# Patient Record
Sex: Male | Born: 1950 | Race: Black or African American | Hispanic: No | Marital: Single | Smoking: Current every day smoker
Health system: Southern US, Community
[De-identification: ages and names within clinical notes are randomized; demographics above are authoritative.]

## PROBLEM LIST (undated history)

## (undated) DIAGNOSIS — C61 Malignant neoplasm of prostate: Secondary | ICD-10-CM

## (undated) DIAGNOSIS — M1990 Unspecified osteoarthritis, unspecified site: Secondary | ICD-10-CM

## (undated) DIAGNOSIS — J189 Pneumonia, unspecified organism: Secondary | ICD-10-CM

## (undated) DIAGNOSIS — I10 Essential (primary) hypertension: Secondary | ICD-10-CM

## (undated) DIAGNOSIS — J302 Other seasonal allergic rhinitis: Secondary | ICD-10-CM

## (undated) DIAGNOSIS — I639 Cerebral infarction, unspecified: Secondary | ICD-10-CM

## (undated) DIAGNOSIS — F329 Major depressive disorder, single episode, unspecified: Secondary | ICD-10-CM

## (undated) DIAGNOSIS — F32A Depression, unspecified: Secondary | ICD-10-CM

## (undated) HISTORY — DX: Malignant neoplasm of prostate: C61

## (undated) HISTORY — DX: Cerebral infarction, unspecified: I63.9

## (undated) HISTORY — DX: Major depressive disorder, single episode, unspecified: F32.9

## (undated) HISTORY — DX: Depression, unspecified: F32.A

## (undated) HISTORY — DX: Other seasonal allergic rhinitis: J30.2

## (undated) HISTORY — DX: Pneumonia, unspecified organism: J18.9

## (undated) HISTORY — DX: Unspecified osteoarthritis, unspecified site: M19.90

## (undated) HISTORY — PX: PROSTATE BIOPSY: SHX241

---

## 2010-01-13 ENCOUNTER — Emergency Department: Payer: Self-pay | Admitting: Emergency Medicine

## 2010-08-20 ENCOUNTER — Ambulatory Visit: Payer: Self-pay | Admitting: Oncology

## 2010-08-26 ENCOUNTER — Ambulatory Visit: Payer: Self-pay | Admitting: Oncology

## 2010-09-04 ENCOUNTER — Ambulatory Visit: Payer: Self-pay | Admitting: Oncology

## 2010-09-24 ENCOUNTER — Ambulatory Visit: Payer: Self-pay | Admitting: Internal Medicine

## 2011-01-28 ENCOUNTER — Emergency Department: Payer: Self-pay | Admitting: Emergency Medicine

## 2011-03-06 ENCOUNTER — Emergency Department: Payer: Self-pay | Admitting: Internal Medicine

## 2012-04-13 ENCOUNTER — Emergency Department: Payer: Self-pay | Admitting: Emergency Medicine

## 2012-04-14 LAB — ETHANOL: Ethanol %: 0.295 % — ABNORMAL HIGH (ref 0.000–0.080)

## 2012-08-04 DIAGNOSIS — J189 Pneumonia, unspecified organism: Secondary | ICD-10-CM

## 2012-08-04 HISTORY — DX: Pneumonia, unspecified organism: J18.9

## 2013-04-04 ENCOUNTER — Emergency Department: Payer: Self-pay | Admitting: Emergency Medicine

## 2014-01-29 ENCOUNTER — Emergency Department: Payer: Self-pay | Admitting: Emergency Medicine

## 2014-02-07 ENCOUNTER — Emergency Department: Payer: Self-pay | Admitting: Emergency Medicine

## 2014-02-07 LAB — BASIC METABOLIC PANEL
ANION GAP: 8 (ref 7–16)
BUN: 6 mg/dL — AB (ref 7–18)
CALCIUM: 8.6 mg/dL (ref 8.5–10.1)
CO2: 26 mmol/L (ref 21–32)
Chloride: 105 mmol/L (ref 98–107)
Creatinine: 0.96 mg/dL (ref 0.60–1.30)
EGFR (African American): >60
EGFR (Non-African Amer.): >60
GLUCOSE: 96 mg/dL (ref 65–99)
OSMOLALITY: 275 (ref 275–301)
Potassium: 4 mmol/L (ref 3.5–5.1)
Sodium: 139 mmol/L (ref 136–145)

## 2014-02-07 LAB — DRUG SCREEN, URINE
Amphetamines, Ur Screen: NEGATIVE (ref ?–1000)
BENZODIAZEPINE, UR SCRN: NEGATIVE (ref ?–200)
Barbiturates, Ur Screen: NEGATIVE (ref ?–200)
COCAINE METABOLITE, UR ~~LOC~~: NEGATIVE (ref ?–300)
Cannabinoid 50 Ng, Ur ~~LOC~~: NEGATIVE (ref ?–50)
MDMA (Ecstasy)Ur Screen: NEGATIVE (ref ?–500)
Methadone, Ur Screen: NEGATIVE (ref ?–300)
OPIATE, UR SCREEN: NEGATIVE (ref ?–300)
Phencyclidine (PCP) Ur S: NEGATIVE (ref ?–25)
Tricyclic, Ur Screen: NEGATIVE (ref ?–1000)

## 2014-02-07 LAB — URINALYSIS, COMPLETE
BILIRUBIN, UR: NEGATIVE
Glucose,UR: NEGATIVE mg/dL (ref 0–75)
Ketone: NEGATIVE
Leukocyte Esterase: NEGATIVE
Nitrite: NEGATIVE
PROTEIN: NEGATIVE
Ph: 6 (ref 4.5–8.0)
RBC,UR: NONE SEEN /HPF (ref 0–5)
Specific Gravity: 1.002 (ref 1.003–1.030)
WBC UR: NONE SEEN /HPF (ref 0–5)

## 2014-02-07 LAB — CBC WITH DIFFERENTIAL/PLATELET
BASOS ABS: 0.1 10*3/uL (ref 0.0–0.1)
Basophil %: 2.4 %
Eosinophil #: 0 10*3/uL (ref 0.0–0.7)
Eosinophil %: 1.2 %
HCT: 48.2 % (ref 40.0–52.0)
HGB: 15.2 g/dL (ref 13.0–18.0)
LYMPHS ABS: 1.2 10*3/uL (ref 1.0–3.6)
LYMPHS PCT: 31.8 %
MCH: 24.9 pg — ABNORMAL LOW (ref 26.0–34.0)
MCHC: 31.5 g/dL — ABNORMAL LOW (ref 32.0–36.0)
MCV: 79 fL — ABNORMAL LOW (ref 80–100)
MONOS PCT: 7.8 %
Monocyte #: 0.3 x10 3/mm (ref 0.2–1.0)
NEUTROS ABS: 2.2 10*3/uL (ref 1.4–6.5)
NEUTROS PCT: 56.8 %
Platelet: 156 10*3/uL (ref 150–440)
RBC: 6.08 10*6/uL — ABNORMAL HIGH (ref 4.40–5.90)
RDW: 14.7 % — ABNORMAL HIGH (ref 11.5–14.5)
WBC: 3.8 10*3/uL (ref 3.8–10.6)

## 2014-02-07 LAB — ETHANOL
Ethanol %: 0.368 % (ref 0.000–0.080)
Ethanol: 368 mg/dL

## 2014-04-21 ENCOUNTER — Emergency Department: Payer: Self-pay | Admitting: Emergency Medicine

## 2014-04-21 LAB — URINALYSIS, COMPLETE
BILIRUBIN, UR: NEGATIVE
BLOOD: NEGATIVE
Glucose,UR: NEGATIVE mg/dL (ref 0–75)
KETONE: NEGATIVE
LEUKOCYTE ESTERASE: NEGATIVE
Nitrite: NEGATIVE
PH: 5 (ref 4.5–8.0)
PROTEIN: NEGATIVE
RBC,UR: 1 /HPF (ref 0–5)
Specific Gravity: 1.01 (ref 1.003–1.030)
Squamous Epithelial: 1
WBC UR: 4 /HPF (ref 0–5)

## 2014-04-21 LAB — BASIC METABOLIC PANEL
Anion Gap: 8 (ref 7–16)
BUN: 7 mg/dL (ref 7–18)
CHLORIDE: 112 mmol/L — AB (ref 98–107)
CO2: 24 mmol/L (ref 21–32)
Calcium, Total: 8.5 mg/dL (ref 8.5–10.1)
Creatinine: 1.09 mg/dL (ref 0.60–1.30)
EGFR (African American): >60
EGFR (Non-African Amer.): >60
Glucose: 80 mg/dL (ref 65–99)
Osmolality: 284 (ref 275–301)
Potassium: 3.7 mmol/L (ref 3.5–5.1)
Sodium: 144 mmol/L (ref 136–145)

## 2014-04-21 LAB — CBC WITH DIFFERENTIAL/PLATELET
BASOS ABS: 0.1 10*3/uL (ref 0.0–0.1)
BASOS PCT: 1.3 %
EOS ABS: 0.2 10*3/uL (ref 0.0–0.7)
EOS PCT: 3.3 %
HCT: 42.2 % (ref 40.0–52.0)
HGB: 13.1 g/dL (ref 13.0–18.0)
Lymphocyte #: 1.7 10*3/uL (ref 1.0–3.6)
Lymphocyte %: 27.3 %
MCH: 24.2 pg — ABNORMAL LOW (ref 26.0–34.0)
MCHC: 31.1 g/dL — AB (ref 32.0–36.0)
MCV: 78 fL — AB (ref 80–100)
MONO ABS: 0.4 x10 3/mm (ref 0.2–1.0)
Monocyte %: 6.7 %
NEUTROS ABS: 3.8 10*3/uL (ref 1.4–6.5)
Neutrophil %: 61.4 %
PLATELETS: 243 10*3/uL (ref 150–440)
RBC: 5.42 10*6/uL (ref 4.40–5.90)
RDW: 18.1 % — AB (ref 11.5–14.5)
WBC: 6.2 10*3/uL (ref 3.8–10.6)

## 2014-04-21 LAB — TROPONIN I: Troponin-I: <0.02 ng/mL

## 2014-06-21 ENCOUNTER — Emergency Department: Payer: Self-pay | Admitting: Emergency Medicine

## 2014-06-21 LAB — CBC
HCT: 48.8 % (ref 40.0–52.0)
HGB: 15.5 g/dL (ref 13.0–18.0)
MCH: 24.9 pg — ABNORMAL LOW (ref 26.0–34.0)
MCHC: 31.7 g/dL — AB (ref 32.0–36.0)
MCV: 79 fL — ABNORMAL LOW (ref 80–100)
Platelet: 206 10*3/uL (ref 150–440)
RBC: 6.22 10*6/uL — ABNORMAL HIGH (ref 4.40–5.90)
RDW: 17.1 % — ABNORMAL HIGH (ref 11.5–14.5)
WBC: 5.3 10*3/uL (ref 3.8–10.6)

## 2014-06-21 LAB — URINALYSIS, COMPLETE
Bilirubin,UR: NEGATIVE
Glucose,UR: NEGATIVE mg/dL (ref 0–75)
Ketone: NEGATIVE
Leukocyte Esterase: NEGATIVE
Nitrite: NEGATIVE
Ph: 6 (ref 4.5–8.0)
Protein: NEGATIVE
RBC,UR: 2 /HPF (ref 0–5)
Specific Gravity: 1.002 (ref 1.003–1.030)
Squamous Epithelial: <1

## 2014-06-21 LAB — COMPREHENSIVE METABOLIC PANEL
ALBUMIN: 3.7 g/dL (ref 3.4–5.0)
Alkaline Phosphatase: 78 U/L
Anion Gap: 8 (ref 7–16)
BILIRUBIN TOTAL: 0.2 mg/dL (ref 0.2–1.0)
BUN: 6 mg/dL — AB (ref 7–18)
CALCIUM: 8.5 mg/dL (ref 8.5–10.1)
CHLORIDE: 109 mmol/L — AB (ref 98–107)
CREATININE: 0.98 mg/dL (ref 0.60–1.30)
Co2: 28 mmol/L (ref 21–32)
EGFR (African American): >60
EGFR (Non-African Amer.): >60
Glucose: 93 mg/dL (ref 65–99)
Osmolality: 286 (ref 275–301)
POTASSIUM: 3.4 mmol/L — AB (ref 3.5–5.1)
SGOT(AST): 34 U/L (ref 15–37)
SGPT (ALT): 40 U/L
SODIUM: 145 mmol/L (ref 136–145)
Total Protein: 8 g/dL (ref 6.4–8.2)

## 2014-06-21 LAB — PROTIME-INR
INR: 1.1
Prothrombin Time: 13.8 secs (ref 11.5–14.7)

## 2014-06-23 LAB — URINE CULTURE

## 2015-11-12 ENCOUNTER — Emergency Department
Admission: EM | Admit: 2015-11-12 | Discharge: 2015-11-12 | Disposition: A | Discharge status: Home / Self Care | Payer: Medicaid Other | Attending: Emergency Medicine | Admitting: Emergency Medicine

## 2015-11-12 ENCOUNTER — Emergency Department: Payer: Medicaid Other

## 2015-11-12 ENCOUNTER — Encounter: Payer: Self-pay | Admitting: Emergency Medicine

## 2015-11-12 DIAGNOSIS — F17210 Nicotine dependence, cigarettes, uncomplicated: Secondary | ICD-10-CM | POA: Diagnosis not present

## 2015-11-12 DIAGNOSIS — Y93K1 Activity, walking an animal: Secondary | ICD-10-CM | POA: Diagnosis not present

## 2015-11-12 DIAGNOSIS — W19XXXA Unspecified fall, initial encounter: Secondary | ICD-10-CM

## 2015-11-12 DIAGNOSIS — S20212A Contusion of left front wall of thorax, initial encounter: Secondary | ICD-10-CM | POA: Diagnosis not present

## 2015-11-12 DIAGNOSIS — Y999 Unspecified external cause status: Secondary | ICD-10-CM | POA: Diagnosis not present

## 2015-11-12 DIAGNOSIS — S299XXA Unspecified injury of thorax, initial encounter: Secondary | ICD-10-CM | POA: Diagnosis present

## 2015-11-12 DIAGNOSIS — Y929 Unspecified place or not applicable: Secondary | ICD-10-CM | POA: Diagnosis not present

## 2015-11-12 DIAGNOSIS — W1839XA Other fall on same level, initial encounter: Secondary | ICD-10-CM | POA: Diagnosis not present

## 2015-11-12 DIAGNOSIS — Z888 Allergy status to other drugs, medicaments and biological substances status: Secondary | ICD-10-CM | POA: Insufficient documentation

## 2015-11-12 DIAGNOSIS — Z880 Allergy status to penicillin: Secondary | ICD-10-CM | POA: Insufficient documentation

## 2015-11-12 DIAGNOSIS — I10 Essential (primary) hypertension: Secondary | ICD-10-CM | POA: Insufficient documentation

## 2015-11-12 HISTORY — DX: Essential (primary) hypertension: I10

## 2015-11-12 NOTE — ED Provider Notes (Signed)
[facility] Emergency Department Provider Note    ____________________________________________  Time seen: ~1735  I have reviewed the triage vital signs and the nursing notes.   HISTORY  Chief Complaint Fall   History limited by: Not Limited   HPI Roger [name] is a 65 y.o. male who presents to the emergency department today because of concerns for left chest wall pain, left hip pain and left knee pain. Asian states that he thinks he hurt his left chest when he fell today. He states that he has frequent falls. He states he falls once a week at least. Sounds like this is been going on for months. The patient states that he is being seen both at [facility] for the falls. Additionally they are working him up for bone disorders given chronic pain. Patient states he is out of his pain medication. He denies any chest pain, shortness breath or fevers.    Past Medical History  Diagnosis Date  . Hypertension     There are no active problems to display for this patient.   No past surgical history on file.  No current outpatient prescriptions on file.  Allergies Penicillins and Aspirin  No family history on file.  Social History Social History  Substance Use Topics  . Smoking status: Current Every Day Smoker -- 0.25 packs/day    Types: Cigarettes  . Smokeless tobacco: None  . Alcohol Use: Yes     Comment: alcohol daily    Review of Systems  Constitutional: Negative for fever. Cardiovascular: Negative for chest pain. Respiratory: Negative for shortness of breath. Gastrointestinal: Negative for abdominal pain, vomiting and diarrhea. Genitourinary: Negative for dysuria. Musculoskeletal: Positive for left chest wall, left hip and left knee pain Neurological: Negative for headaches, focal weakness or numbness.  10-point ROS otherwise negative.  ____________________________________________   PHYSICAL EXAM:  VITAL SIGNS: ED Triage  Vitals  Enc Vitals Group     BP 11/12/15 1602 138/74 mmHg     Pulse Rate 11/12/15 1602 103     Resp 11/12/15 1602 18     Temp 11/12/15 1602 98 F (36.7 C)     Temp Source 11/12/15 1602 Oral     SpO2 11/12/15 1602 96 %     Weight 11/12/15 1602 140 lb (63.504 kg)     Height 11/12/15 1602 5\' 7"  (1.702 m)     Head Cir --      Peak Flow --      Pain Score 11/12/15 1551 10   Constitutional: Alert and oriented. Well appearing and in no distress. Eyes: Conjunctivae are normal. PERRL. Normal extraocular movements. ENT   Head: Normocephalic and atraumatic.   Nose: No congestion/rhinnorhea.   Mouth/Throat: Mucous membranes are moist.   Neck: No stridor. Hematological/Lymphatic/Immunilogical: No cervical lymphadenopathy. Cardiovascular: Normal rate, regular rhythm.  No murmurs, rubs, or gallops. Respiratory: Normal respiratory effort without tachypnea nor retractions. Breath sounds are clear and equal bilaterally. No wheezes/rales/rhonchi. Gastrointestinal: Soft and nontender. No distention.  Genitourinary: Deferred Musculoskeletal: Normal range of motion in all extremities. No joint effusions.  No lower extremity tenderness nor edema.No obvious deformities noted to either knee. Neurologic:  Normal speech and language. No gross focal neurologic deficits are appreciated.  Skin:  Skin is warm, dry and intact. No rash noted. Psychiatric: Mood and affect are normal. Speech and behavior are normal. Patient exhibits appropriate insight and judgment.  ____________________________________________    LABS (pertinent positives/negatives)  None  ____________________________________________   EKG  I,  Nance Pear, attending physician, personally viewed and interpreted this EKG  EKG Time: 1604 Rate: 88 Rhythm: normal sinus rhythm Axis: normal Intervals: qtc 442 QRS: narrow ST changes: no st elevation Impression: normal ekg ____________________________________________     RADIOLOGY  CXR IMPRESSION: No acute infiltrate or pleural effusion. No pulmonary edema. There is no pneumothorax. No gross fractures are noted. Stable old fracture of the right eighth rib. Probable old fracture of the right ninth and tenth rib. Clinical correlation is necessary.  Left hip IMPRESSION: No acute fracture or subluxation. Postsurgical changes from prior left acetabular fracture repair. Dystrophic calcifications are noted just lateral to superior acetabulum and above the femoral head.  Left knee IMPRESSION: Mild lateral patellar subluxation. No dislocation. Evidence of old trauma along the distal femur medially without acute fracture evident. No appreciable arthropathic change. No joint effusion.  ____________________________________________   PROCEDURES  Procedure(s) performed: None  Critical Care performed: No  ____________________________________________   INITIAL IMPRESSION / ASSESSMENT AND PLAN / ED COURSE  Pertinent labs & imaging results that were available during my care of the patient were reviewed by me and considered in my medical decision making (see chart for details).  Patient presented to the emergency department today because of concerns primarily for left chest wall pain after the fall. Patient also had pain in the left hip and left knee. Patient was able to get up and walk after the fall to call the rescue squad. X-rays today do not show any acute osseous abnormality. The patient will have an Ace wrap applied to the left knee given some patellar subluxation. Patient states that he is being worked up for his frequent falls.  ____________________________________________   FINAL CLINICAL IMPRESSION(S) / ED DIAGNOSES  Final diagnoses:  Fall, initial encounter  Rib contusion, left, initial encounter     Nance Pear, MD 11/12/15 1810

## 2015-11-12 NOTE — ED Notes (Signed)
Patient presents to the ED via [facility] post fall.  Patient is complaining of rib pain.  Patient states he was walking his dog and his leg "gave out".  Patient showed this RN how his left knee appears deformed.  Patient reports pain when breathing and speaking.  Patient is also complaining of left hip pain.

## 2015-11-12 NOTE — Discharge Instructions (Signed)
Please seek medical attention for any high fevers, chest pain, shortness of breath, change in behavior, persistent vomiting, bloody stool or any other new or concerning symptoms.   Chest Contusion A contusion is a deep bruise. Bruises happen when an injury causes bleeding under the skin. Signs of bruising include pain, puffiness (swelling), and discolored skin. The bruise may turn blue, purple, or yellow.  HOME CARE  Put ice on the injured area.  Put ice in a plastic bag.  Place a towel between the skin and the bag.  Leave the ice on for 15-20 minutes at a time, 03-04 times a day for the first 48 hours.  Only take medicine as told by your doctor.  Rest.  Take deep breaths (deep-breathing exercises) as told by your doctor.  Stop smoking if you smoke.  Do not lift objects over 5 pounds (2.3 kilograms) for 3 days or longer if told by your doctor. GET HELP RIGHT AWAY IF:   You have more bruising or puffiness.  You have pain that gets worse.  You have trouble breathing.  You are dizzy, weak, or pass out (faint).  You have blood in your pee (urine) or poop (stool).  You cough up or throw up (vomit) blood.  Your puffiness or pain is not helped with medicines. MAKE SURE YOU:   Understand these instructions.  Will watch your condition.  Will get help right away if you are not doing well or get worse.   This information is not intended to replace advice given to you by your health care provider. Make sure you discuss any questions you have with your health care provider.   Document Released: 01/07/2008 Document Revised: 04/14/2012 Document Reviewed: 01/12/2012 Elsevier Interactive Patient Education [employer].

## 2016-09-22 ENCOUNTER — Other Ambulatory Visit (HOSPITAL_COMMUNITY): Payer: Self-pay | Admitting: Radiation Oncology

## 2016-09-22 DIAGNOSIS — C61 Malignant neoplasm of prostate: Secondary | ICD-10-CM

## 2016-09-26 ENCOUNTER — Ambulatory Visit (HOSPITAL_COMMUNITY)
Admission: RE | Admit: 2016-09-26 | Discharge: 2016-09-26 | Disposition: A | Discharge status: Home / Self Care | Payer: Medicare Other | Source: Ambulatory Visit | Attending: Radiation Oncology | Admitting: Radiation Oncology

## 2016-09-26 DIAGNOSIS — K769 Liver disease, unspecified: Secondary | ICD-10-CM | POA: Diagnosis not present

## 2016-09-26 DIAGNOSIS — C61 Malignant neoplasm of prostate: Secondary | ICD-10-CM | POA: Insufficient documentation

## 2016-09-26 LAB — POCT I-STAT CREATININE: Creatinine, Ser: 0.8 mg/dL (ref 0.61–1.24)

## 2016-09-26 MED ORDER — GADOBENATE DIMEGLUMINE 529 MG/ML IV SOLN
15.0000 mL | Freq: Once | INTRAVENOUS | Status: AC | PRN
  Administered 2016-09-26: 13 mL via INTRAVENOUS

## 2016-10-09 ENCOUNTER — Telehealth: Payer: Self-pay

## 2016-10-09 ENCOUNTER — Encounter: Payer: Self-pay | Admitting: Nurse Practitioner

## 2016-10-09 ENCOUNTER — Other Ambulatory Visit: Payer: Self-pay

## 2016-10-09 ENCOUNTER — Ambulatory Visit (INDEPENDENT_AMBULATORY_CARE_PROVIDER_SITE_OTHER): Payer: Medicare Other | Admitting: Nurse Practitioner

## 2016-10-09 DIAGNOSIS — C61 Malignant neoplasm of prostate: Secondary | ICD-10-CM

## 2016-10-09 DIAGNOSIS — R935 Abnormal findings on diagnostic imaging of other abdominal regions, including retroperitoneum: Secondary | ICD-10-CM

## 2016-10-09 MED ORDER — PEG 3350-KCL-NA BICARB-NACL 420 G PO SOLR: 4000.0000 mL | ORAL | 0 refills | Status: DC

## 2016-10-09 NOTE — Assessment & Plan Note (Addendum)
The patient is being seen by [facility] cancer center for prostate cancer. CT of the abdomen showed irregular wall thickening in the cecum with mild adjacent fat stranding possibly inflammatory but underlying primary lesion could not be excluded. He has not had a colonoscopy and an unknown amount of time. They are requesting colonoscopy to evaluate for underlying lesion prior to starting radiation therapy for prostate cancer. We will plan to proceed with colonoscopy as soon as possible in order to allow [facility] to continue with her cancer care. Return for follow-up based on postprocedure recommendations.  Proceed with colonoscopy with possible sedation augmentation with Dr. Oneida Alar in the near future. The risks, benefits, and alternatives have been discussed in detail with the patient. They state understanding and desire to proceed.   Patient is on Neurontin and Remeron once daily. Drinks 4 beers a week. No other anticoagulants, anxiolytics, chronic pain medications, or antidepressants. He may require sedation augmentation, and I will discuss this with Dr. Oneida Alar for final plans in order to allow his EGD in evaluation as possible with the most appropriate sedation possible.

## 2016-10-09 NOTE — Progress Notes (Signed)
cc'ed to pcp °

## 2016-10-09 NOTE — Progress Notes (Signed)
Discussed with Dr. Oneida Alar. Will schedule on Monday 10/13/16 with conscious sedation and 12.5 mg pre-procedure Phenergan. Notified Tretha Sciara who will handle scheduling.

## 2016-10-09 NOTE — Telephone Encounter (Signed)
Called Everlene Farrier (caregiver) and informed of colonoscopy 10/13/16. Procedure time changed to 2:15pm, pt to arrive at 1:15pm. New instructions faxed to caregiver at [phone]. Informed her to pick-up rx at [facility] and fleets enema/Dulcolax tablets.

## 2016-10-09 NOTE — Telephone Encounter (Signed)
EG spoke to Dr. Oneida Alar who advised for pt to have Colonoscopy 10/13/16 with Phenergan 12.5mg  pre-procedure. Procedure scheduled for 2:30pm. Tried to call caregiver Verlene Mayer), LMOVM about procedure and asked her to call office.   Rx for prep sent to [facility]. Instructions faxed to pharmacy. Orders entered for procedure.

## 2016-10-09 NOTE — Assessment & Plan Note (Signed)
Being treated at [facility] for prostate cancer. Planned [facility]/radiation therapy. Awaiting colonoscopy to evaluate abnormal CT prior to beginning therapy. See additional details below. Return for follow-up based on postprocedure recommendations.

## 2016-10-09 NOTE — Patient Instructions (Signed)
1. I will discuss with Dr. fields the scheduling of your procedure in order to obtain the procedure as soon as possible. 2. We will call you and notify you of the day in time of your procedure. We will also provide colonoscopy prep instructions as well. 3. Further recommendations to be made after colonoscopy. 4. Return for follow-up based on recommendations made after your colonoscopy.

## 2016-10-09 NOTE — Progress Notes (Addendum)
REVIEWED-NO ADDITIONAL RECOMMENDATIONS.  Primary Care Physician:  Shade Flood, MD Primary Gastroenterologist:  Dr. Oneida Alar  Chief Complaint  Patient presents with  . Colonoscopy    consult    HPI:   Roger [name] is a 66 y.o. male who presents on referral from [facility] cancer center where he is undergoing treatment for prostate cancer. They requested he be seen by GI related to abdominal pain, abnormal MRI of the abdomen, and to request colonoscopy. No colonoscopy records found in our system. Last saw [facility] cancer center on 09/15/2016 with a diagnosis of low intermediate risk prostate cancer. Patient elected radiation. He is not a surgical candidate. CT findings as per below. They are waiting colonoscopy prior to scheduling radiation treatment start.  Cancer center notes reviewed. Notes state requiring colonoscopy after abnormal findings on CT scan completed at [facility] which consisted of irregular wall thickening in the cecum with mild adjacent fat stranding. This may be inflammatory, however a secondary underlying primary lesion cannot be excluded. CT was completed to 03/23/2017. MRI was completed to follow-up on not well characterized liver lesions noted on CT as well.  MRI completed 09/26/2016 which found no acute findings or explanation for the patient's symptoms. Indeterminate lesion in the medial segment of the left hepatic lobe but notation that hepatic metastasis from prostate cancer is uncommon especially with recent diagnosis but cannot be completely excluded and recommend correlation with serum PSA levels and MRI follow-up in 3 months.  The patient lives at [facility] [place], [place]. Today he is accompanied by his caregiver at the facility. Today he states he's already had a colonoscopy at [facility] a month ago. Caregiver states this was a prostate biopsy. He seems to be confusing the two. Admits abdominal pain, has bee intermittent but is now more constant, sometimes  sharp. Has a bowel movement about once a day to three times a day. Stools "pass easily." Denies hematochezia, melena, weight loss. States he has a fever but describes it as "sometimes I get cold." Denies chest pain, dizziness, lightheadedness, syncope, near syncope. Denies any other upper or lower GI symptoms.  Past Medical History:  Diagnosis Date  . Arthritis   . Depression   . Hypertension   . Pneumonia 2014   Required chest tube placement  . Prostate cancer ([place])   . Seasonal allergies   . Stroke (cerebrum) [facility])     Past Surgical History:  Procedure Laterality Date  . PROSTATE BIOPSY      Current Outpatient Prescriptions  Medication Sig Dispense Refill  . amLODipine (NORVASC) 5 MG tablet Take 10 mg by mouth daily.    . beclomethasone (QVAR) 80 MCG/ACT inhaler Inhale 1 puff into the lungs 2 (two) times daily.    . fluticasone (FLONASE) 50 MCG/ACT nasal spray Place 1 spray into both nostrils daily.    . folic acid (FOLVITE) 1 MG tablet Take 1 mg by mouth daily.    Marland Kitchen gabapentin (NEURONTIN) 300 MG capsule Take 300 mg by mouth 2 (two) times daily.    Marland Kitchen ibuprofen (ADVIL,MOTRIN) 600 MG tablet Take 1 tablet by mouth as needed.    . loratadine (CLARITIN) 10 MG tablet Take 10 mg by mouth daily.    . mirtazapine (REMERON) 15 MG tablet Take 15 mg by mouth daily.    . Multiple Vitamin (MULTI-VITAMINS) TABS Take 1 tablet by mouth daily.    Marland Kitchen omeprazole (PRILOSEC) 40 MG capsule Take 1 capsule by mouth daily.    Marland Kitchen  PROAIR HFA 108 (90 Base) MCG/ACT inhaler Take 2 puffs by mouth every 4 (four) hours as needed.    . tamsulosin (FLOMAX) 0.4 MG CAPS capsule Take 1 capsule by mouth daily.    Marland Kitchen thiamine (VITAMIN B-1) 50 MG tablet Take 100 mg by mouth daily.    Marland Kitchen zinc sulfate 220 (50 Zn) MG capsule Take 220 mg by mouth daily.     No current facility-administered medications for this visit.     Allergies as of 10/09/2016 - Review Complete 10/09/2016  Allergen Reaction Noted  . Penicillins  Anaphylaxis 11/12/2015  . Aspirin Hives 11/12/2015    Family History  Problem Relation Age of Onset  . Heart attack Mother   . Diabetes Sister   . CAD Sister   . Kidney failure Brother     Social History   Social History  . Marital status: Single    Spouse name: N/A  . Number of children: N/A  . Years of education: N/A   Occupational History  . Not on file.   Social History Main Topics  . Smoking status: Current Every Day Smoker    Packs/day: 0.50    Years: 30.00    Types: Cigarettes  . Smokeless tobacco: Never Used  . Alcohol use Yes     Comment: 4 cans of beer per week  . Drug use: No  . Sexual activity: Not on file   Other Topics Concern  . Not on file   Social History Narrative  . No narrative on file    Review of Systems: Complete ROS negative except as per HPI.    Physical Exam: BP 140/85   Pulse 79   Temp 97.8 F (36.6 C) (Oral)   Ht 6\' 1"  (1.854 m)   Wt 177 lb (80.3 kg)   BMI 23.35 kg/m  General:   Alert and oriented. Pleasant and cooperative. Well-nourished and well-developed.  Head:  Normocephalic and atraumatic. Eyes:  Without icterus, sclera clear and conjunctiva pink.  Ears:  Normal auditory acuity. Mouth:  No deformity or lesions, oral mucosa pink.  Throat/Neck:  Supple, without mass or thyromegaly. Cardiovascular:  S1, S2 present without murmurs appreciated. Normal pulses noted. Extremities without clubbing or edema. Respiratory:  Clear to auscultation bilaterally. No wheezes, rales, or rhonchi. No distress.  Gastrointestinal:  +BS, soft, non-tender and non-distended. No HSM noted. No guarding or rebound. No masses appreciated.  Rectal:  Deferred  Musculoskalatal:  Symmetrical without gross deformities. Normal posture. Skin:  Intact without significant lesions or rashes. Neurologic:  Alert and oriented x4;  grossly normal neurologically. Psych:  Alert and cooperative. Normal mood and affect. Heme/Lymph/Immune: No significant cervical  adenopathy. No excessive bruising noted.    10/09/2016 11:28 AM   Disclaimer: This note was dictated with voice recognition software. Similar sounding words can inadvertently be transcribed and may not be corrected upon review.

## 2016-10-13 ENCOUNTER — Encounter (HOSPITAL_COMMUNITY)
Admission: RE | Disposition: A | Discharge status: Home Health | Payer: Self-pay | Source: Ambulatory Visit | Attending: Gastroenterology

## 2016-10-13 ENCOUNTER — Ambulatory Visit (HOSPITAL_COMMUNITY)
Admission: RE | Admit: 2016-10-13 | Discharge: 2016-10-13 | Disposition: A | Discharge status: Home Health | Payer: Medicare Other | Source: Ambulatory Visit | Attending: Gastroenterology | Admitting: Gastroenterology

## 2016-10-13 ENCOUNTER — Encounter (HOSPITAL_COMMUNITY): Payer: Self-pay

## 2016-10-13 DIAGNOSIS — Z79899 Other long term (current) drug therapy: Secondary | ICD-10-CM | POA: Diagnosis not present

## 2016-10-13 DIAGNOSIS — Q438 Other specified congenital malformations of intestine: Secondary | ICD-10-CM | POA: Insufficient documentation

## 2016-10-13 DIAGNOSIS — I10 Essential (primary) hypertension: Secondary | ICD-10-CM | POA: Diagnosis not present

## 2016-10-13 DIAGNOSIS — F329 Major depressive disorder, single episode, unspecified: Secondary | ICD-10-CM | POA: Diagnosis not present

## 2016-10-13 DIAGNOSIS — Z881 Allergy status to other antibiotic agents status: Secondary | ICD-10-CM | POA: Diagnosis not present

## 2016-10-13 DIAGNOSIS — K644 Residual hemorrhoidal skin tags: Secondary | ICD-10-CM | POA: Diagnosis not present

## 2016-10-13 DIAGNOSIS — R948 Abnormal results of function studies of other organs and systems: Secondary | ICD-10-CM | POA: Insufficient documentation

## 2016-10-13 DIAGNOSIS — Z8673 Personal history of transient ischemic attack (TIA), and cerebral infarction without residual deficits: Secondary | ICD-10-CM | POA: Insufficient documentation

## 2016-10-13 DIAGNOSIS — Z886 Allergy status to analgesic agent status: Secondary | ICD-10-CM | POA: Insufficient documentation

## 2016-10-13 DIAGNOSIS — Z8546 Personal history of malignant neoplasm of prostate: Secondary | ICD-10-CM | POA: Diagnosis not present

## 2016-10-13 DIAGNOSIS — F17210 Nicotine dependence, cigarettes, uncomplicated: Secondary | ICD-10-CM | POA: Insufficient documentation

## 2016-10-13 DIAGNOSIS — Z7951 Long term (current) use of inhaled steroids: Secondary | ICD-10-CM | POA: Insufficient documentation

## 2016-10-13 DIAGNOSIS — R935 Abnormal findings on diagnostic imaging of other abdominal regions, including retroperitoneum: Secondary | ICD-10-CM

## 2016-10-13 DIAGNOSIS — M1990 Unspecified osteoarthritis, unspecified site: Secondary | ICD-10-CM | POA: Diagnosis not present

## 2016-10-13 DIAGNOSIS — C61 Malignant neoplasm of prostate: Secondary | ICD-10-CM

## 2016-10-13 DIAGNOSIS — K648 Other hemorrhoids: Secondary | ICD-10-CM

## 2016-10-13 HISTORY — PX: COLONOSCOPY: SHX5424

## 2016-10-13 SURGERY — COLONOSCOPY
Anesthesia: Moderate Sedation

## 2016-10-13 MED ORDER — MIDAZOLAM HCL 5 MG/5ML IJ SOLN
INTRAMUSCULAR | Status: DC | PRN
  Administered 2016-10-13: 2 mg via INTRAVENOUS
  Administered 2016-10-13: 1 mg via INTRAVENOUS

## 2016-10-13 MED ORDER — PROMETHAZINE HCL 25 MG/ML IJ SOLN
12.5000 mg | Freq: Once | INTRAMUSCULAR | Status: AC
  Administered 2016-10-13: 12.5 mg via INTRAVENOUS

## 2016-10-13 MED ORDER — MIDAZOLAM HCL 5 MG/5ML IJ SOLN
INTRAMUSCULAR | Status: AC
  Filled 2016-10-13: qty 10

## 2016-10-13 MED ORDER — PROMETHAZINE HCL 25 MG/ML IJ SOLN
INTRAMUSCULAR | Status: AC
  Filled 2016-10-13: qty 1

## 2016-10-13 MED ORDER — MEPERIDINE HCL 100 MG/ML IJ SOLN
INTRAMUSCULAR | Status: DC | PRN
  Administered 2016-10-13: 25 mg via INTRAVENOUS

## 2016-10-13 MED ORDER — SODIUM CHLORIDE 0.9% FLUSH
INTRAVENOUS | Status: AC
  Filled 2016-10-13: qty 10

## 2016-10-13 MED ORDER — MEPERIDINE HCL 100 MG/ML IJ SOLN
INTRAMUSCULAR | Status: AC
  Filled 2016-10-13: qty 2

## 2016-10-13 MED ORDER — SODIUM CHLORIDE 0.9 % IV SOLN
INTRAVENOUS | Status: DC
  Administered 2016-10-13: 1000 mL via INTRAVENOUS

## 2016-10-13 NOTE — Op Note (Addendum)
[facility] Patient Name: Roger [name] Procedure Date: 10/13/2016 1:55 PM MRN: [id number] Date of Birth: 26-Aug-1950 Attending MD: Barney Drain , MD CSN: [id number] Age: 66 Admit Type: Outpatient Procedure:                Colonoscopy, DIAGNOSTIC Indications:              Abnormal CT of the GI tract Providers:                Barney Drain, MD, Janeece Riggers, RN, [place],                            Merchant navy officer Referring MD:             Shade Flood Medicines:                Meperidine 25 mg IV, Midazolam 3 mg IV,                            Promethazine 46.5 mg IV Complications:            No immediate complications. Estimated Blood Loss:     Estimated blood loss: none. Procedure:                Pre-Anesthesia Assessment:                           - Prior to the procedure, a History and Physical                            was performed, and patient medications and                            allergies were reviewed. The patient's tolerance of                            previous anesthesia was also reviewed. The risks                            and benefits of the procedure and the sedation                            options and risks were discussed with the patient.                            All questions were answered, and informed consent                            was obtained. Prior Anticoagulants: The patient has                            taken no previous anticoagulant or antiplatelet                            agents. ASA Grade Assessment: II - A patient with  mild systemic disease. After reviewing the risks                            and benefits, the patient was deemed in                            satisfactory condition to undergo the procedure.                            After obtaining informed consent, the colonoscope                            was passed under direct vision. Throughout the                            procedure, the  patient's blood pressure, pulse, and                            oxygen saturations were monitored continuously. The                            EC-[id number] ([id number]) scope was introduced through                            the anus and advanced to the the cecum, identified                            by appendiceal orifice and ileocecal valve. The                            colonoscopy was somewhat difficult due to a                            tortuous colon. Successful completion of the                            procedure was aided by COLOWRAP. The patient                            tolerated the procedure well. The quality of the                            bowel preparation was good. The ileocecal valve,                            appendiceal orifice, and rectum were photographed. Scope In: 2:13:00 PM Scope Out: 2:30:47 PM Scope Withdrawal Time: 0 hours 14 minutes 2 seconds  Total Procedure Duration: 0 hours 17 minutes 47 seconds  Findings:      The recto-sigmoid colon and sigmoid colon were moderately redundant.      The exam was otherwise without abnormality.      External and internal hemorrhoids were found. The hemorrhoids were small.      -NORMAL CECUM Impression:               -  Redundant left colon.                           - The examination was otherwise normal.                           - External and internal hemorrhoids. Moderate Sedation:      Moderate (conscious) sedation was administered by the endoscopy nurse       and supervised by the endoscopist. The following parameters were       monitored: oxygen saturation, heart rate, blood pressure, and response       to care. Total physician intraservice time was 27 minutes. Recommendation:           - High fiber diet.                           - Continue present medications.                           - Repeat colonoscopy in 10 years for surveillance.                           - Patient has a contact number available for                             emergencies. The signs and symptoms of potential                            delayed complications were discussed with the                            patient. Return to normal activities tomorrow.                            Written discharge instructions were provided to the                            patient. Procedure Code(s):        --- Professional ---                           [phone], Colonoscopy, flexible; diagnostic, including                            collection of specimen(s) by brushing or washing,                            when performed (separate procedure)                           99152, Moderate sedation services provided by the                            same physician or other qualified health care  professional performing the diagnostic or                            therapeutic service that the sedation supports,                            requiring the presence of an independent trained                            observer to assist in the monitoring of the                            patient's level of consciousness and physiological                            status; initial 15 minutes of intraservice time,                            patient age 30 years or older                           [phone], Moderate sedation services; each additional                            15 minutes intraservice time Diagnosis Code(s):        --- Professional ---                           K64.8, Other hemorrhoids                           R93.3, Abnormal findings on diagnostic imaging of                            other parts of digestive tract                           Q43.8, Other specified congenital malformations of                            intestine CPT copyright 2016 American Medical Association. All rights reserved. The codes documented in this report are preliminary and upon coder review may  be revised to meet current compliance  requirements. Barney Drain, MD Barney Drain, MD 10/13/2016 2:56:35 PM This report has been signed electronically. Number of Addenda: 0

## 2016-10-13 NOTE — H&P (Signed)
Primary Care Physician:  Shade Flood, MD Primary Gastroenterologist:  Dr. Oneida Alar  Pre-Procedure History & Physical: HPI:  Roger [name] is a 66 y.o. male here for Abnormal CT scan: thickened cecum on CT Feb 2018.  Past Medical History:  Diagnosis Date  . Arthritis   . Depression   . Hypertension   . Pneumonia 2014   Required chest tube placement  . Prostate cancer ([place])   . Seasonal allergies   . Stroke (cerebrum) [facility])     Past Surgical History:  Procedure Laterality Date  . PROSTATE BIOPSY      Prior to Admission medications   Medication Sig Start Date End Date Taking? Authorizing Provider  amLODipine (NORVASC) 10 MG tablet Take 10 mg by mouth daily.   Yes Historical Provider, MD  beclomethasone (QVAR) 80 MCG/ACT inhaler Inhale 1 puff into the lungs 2 (two) times daily.   Yes Historical Provider, MD  fluticasone (FLONASE) 50 MCG/ACT nasal spray Place 1 spray into both nostrils daily. 09/17/16  Yes Historical Provider, MD  folic acid (FOLVITE) 1 MG tablet Take 1 mg by mouth daily.   Yes Historical Provider, MD  gabapentin (NEURONTIN) 300 MG capsule Take 300 mg by mouth 2 (two) times daily.   Yes Historical Provider, MD  ibuprofen (ADVIL,MOTRIN) 600 MG tablet Take 1 tablet by mouth every 8 (eight) hours as needed for mild pain or moderate pain.  12/14/09  Yes Historical Provider, MD  loratadine (CLARITIN) 10 MG tablet Take 10 mg by mouth daily.   Yes Historical Provider, MD  mirtazapine (REMERON) 15 MG tablet Take 15 mg by mouth at bedtime.  10/07/16  Yes Historical Provider, MD  Multiple Vitamin (MULTI-VITAMINS) TABS Take 1 tablet by mouth daily.   Yes Historical Provider, MD  omeprazole (PRILOSEC) 40 MG capsule Take 1 capsule by mouth daily. 06/16/08  Yes Historical Provider, MD  PROAIR HFA 108 (90 Base) MCG/ACT inhaler Take 2 puffs by mouth every 4 (four) hours as needed. 09/17/16  Yes Historical Provider, MD  tamsulosin (FLOMAX) 0.4 MG CAPS capsule Take 1 capsule by  mouth daily. 10/07/16  Yes Historical Provider, MD  thiamine (VITAMIN B-1) 100 MG tablet Take 100 mg by mouth daily.   Yes Historical Provider, MD  zinc sulfate 220 (50 Zn) MG capsule Take 220 mg by mouth daily.   Yes Historical Provider, MD    Allergies as of 10/09/2016 - Review Complete 10/09/2016  Allergen Reaction Noted  . Penicillins Anaphylaxis 11/12/2015  . Aspirin Hives 11/12/2015    Family History  Problem Relation Age of Onset  . Heart attack Mother   . Diabetes Sister   . CAD Sister   . Kidney failure Brother     Social History   Social History  . Marital status: Single    Spouse name: N/A  . Number of children: N/A  . Years of education: N/A   Occupational History  . Not on file.   Social History Main Topics  . Smoking status: Current Every Day Smoker    Packs/day: 0.50    Years: 30.00    Types: Cigarettes  . Smokeless tobacco: Never Used  . Alcohol use Yes     Comment: 4 cans of beer per week  . Drug use: No  . Sexual activity: Not on file   Other Topics Concern  . Not on file   Social History Narrative  . No narrative on file    Review of Systems: See HPI, otherwise negative ROS  Physical Exam: BP 135/67   Pulse 79   Temp 97.6 F (36.4 C) (Oral)   Resp 16   SpO2 99%  General:   Alert,  pleasant and cooperative in NAD Head:  Normocephalic and atraumatic. Neck:  Supple; Lungs:  Clear throughout to auscultation.    Heart:  Regular rate and rhythm. Abdomen:  Soft, nontender and nondistended. Normal bowel sounds, without guarding, and without rebound.   Neurologic:  Alert and  oriented x4;  grossly normal neurologically.  Impression/Plan:    Abnormal CT scan: thickened cecum on CT Feb 2018.  Plan: 1. TCS TODAY. DISCUSSED PROCEDURE, BENEFITS, & RISKS: < 1% chance of medication reaction, bleeding, perforation, or rupture of spleen/liver.

## 2016-10-13 NOTE — Discharge Instructions (Signed)
You have internal hemorrhoids. YOU DID NOT HAVE ANY POLYPS.  Next colonoscopy in 10 years.  FOLLOW A HIGH FIBER DIET. AVOID ITEMS THAT CAUSE BLOATING.   USE PREPARATION H FOUR TIMES  A DAY IF NEEDED TO RELIEVE RECTAL PAIN/PRESSURE/BLEEDING.   Colonoscopy Care After Read the instructions outlined below and refer to this sheet in the next week. These discharge instructions provide you with general information on caring for yourself after you leave the hospital. While your treatment has been planned according to the most current medical practices available, unavoidable complications occasionally occur. If you have any problems or questions after discharge, call DR. Kinzi Frediani, [phone].  ACTIVITY  You may resume your regular activity, but move at a slower pace for the next 24 hours.   Take frequent rest periods for the next 24 hours.   Walking will help get rid of the air and reduce the bloated feeling in your belly (abdomen).   No driving for 24 hours (because of the medicine (anesthesia) used during the test).   You may shower.   Do not sign any important legal documents or operate any machinery for 24 hours (because of the anesthesia used during the test).    NUTRITION  Drink plenty of fluids.   You may resume your normal diet as instructed by your doctor.   Begin with a light meal and progress to your normal diet. Heavy or fried foods are harder to digest and may make you feel sick to your stomach (nauseated).   Avoid alcoholic beverages for 24 hours or as instructed.    MEDICATIONS  You may resume your normal medications.   WHAT YOU CAN EXPECT TODAY  Some feelings of bloating in the abdomen.   Passage of more gas than usual.   Spotting of blood in your stool or on the toilet paper  .  IF YOU HAD POLYPS REMOVED DURING THE COLONOSCOPY:  Eat a soft diet IF YOU HAVE NAUSEA, BLOATING, ABDOMINAL PAIN, OR VOMITING.    FINDING OUT THE RESULTS OF YOUR TEST Not all  test results are available during your visit. DR. Oneida Alar WILL CALL YOU WITHIN 7 DAYS OF YOUR PROCEDUE WITH YOUR RESULTS. Do not assume everything is normal if you have not heard from DR. Cola Highfill IN ONE WEEK, CALL HER OFFICE AT [phone].  SEEK IMMEDIATE MEDICAL ATTENTION AND CALL THE OFFICE: [phone] IF:  You have more than a spotting of blood in your stool.   Your belly is swollen (abdominal distention).   You are nauseated or vomiting.   You have a temperature over 101F.   You have abdominal pain or discomfort that is severe or gets worse throughout the day.

## 2016-10-13 NOTE — Progress Notes (Signed)
Spoke to [employer] at the [facility]. Discussed discharge instructions with her. She verbalized understanding.

## 2016-10-15 ENCOUNTER — Encounter (HOSPITAL_COMMUNITY): Payer: Self-pay | Admitting: Gastroenterology

## 2016-10-20 ENCOUNTER — Encounter (HOSPITAL_COMMUNITY): Payer: Self-pay | Admitting: Gastroenterology

## 2017-04-01 IMAGING — CR DG CHEST 2V
2 series · 2 of 2 positions shown · non-contrast
Comparison: 04/21/2014

CLINICAL DATA: Hypertension, shortness of breath, fall

EXAM:
CHEST  2 VIEW

[chest lat]
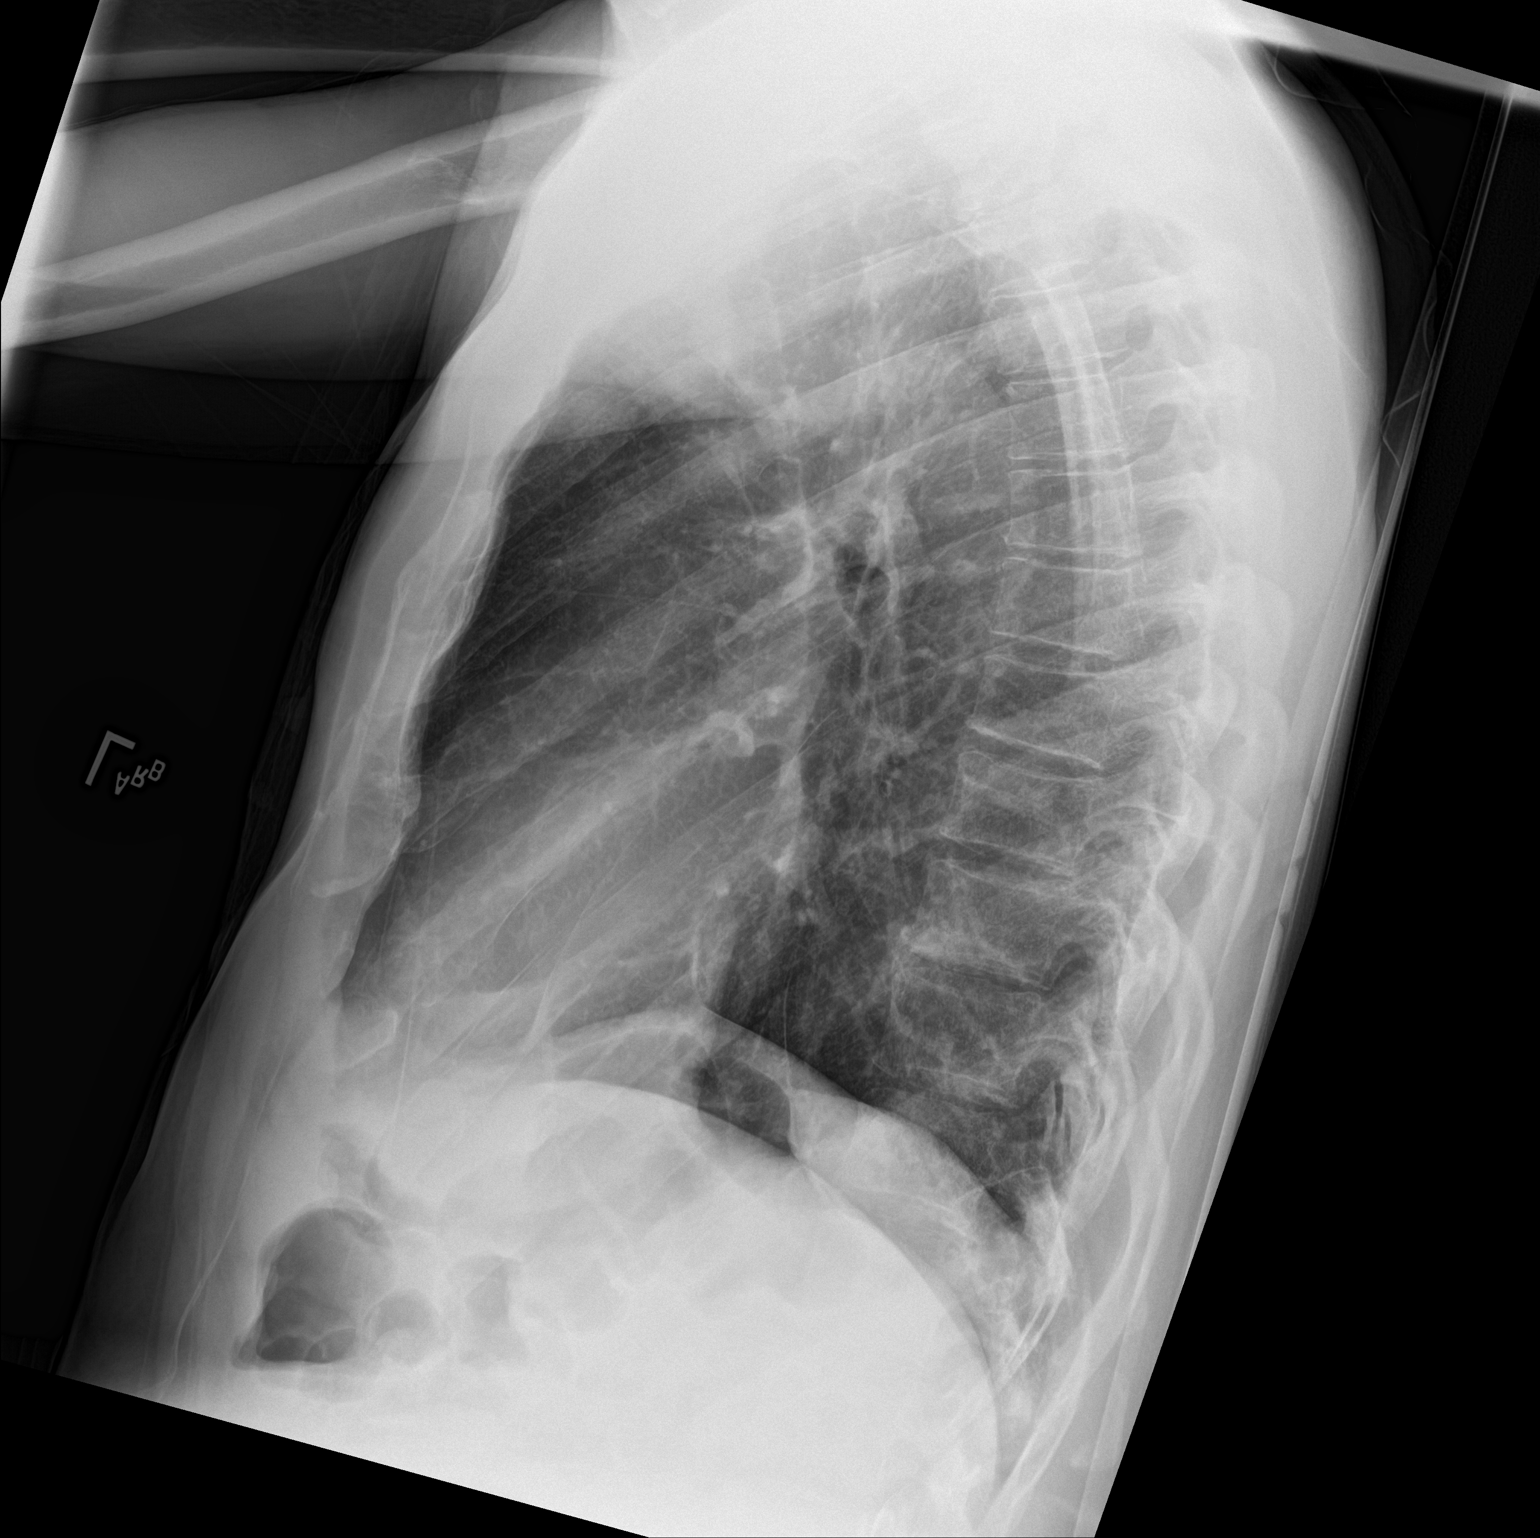

[chest ap]
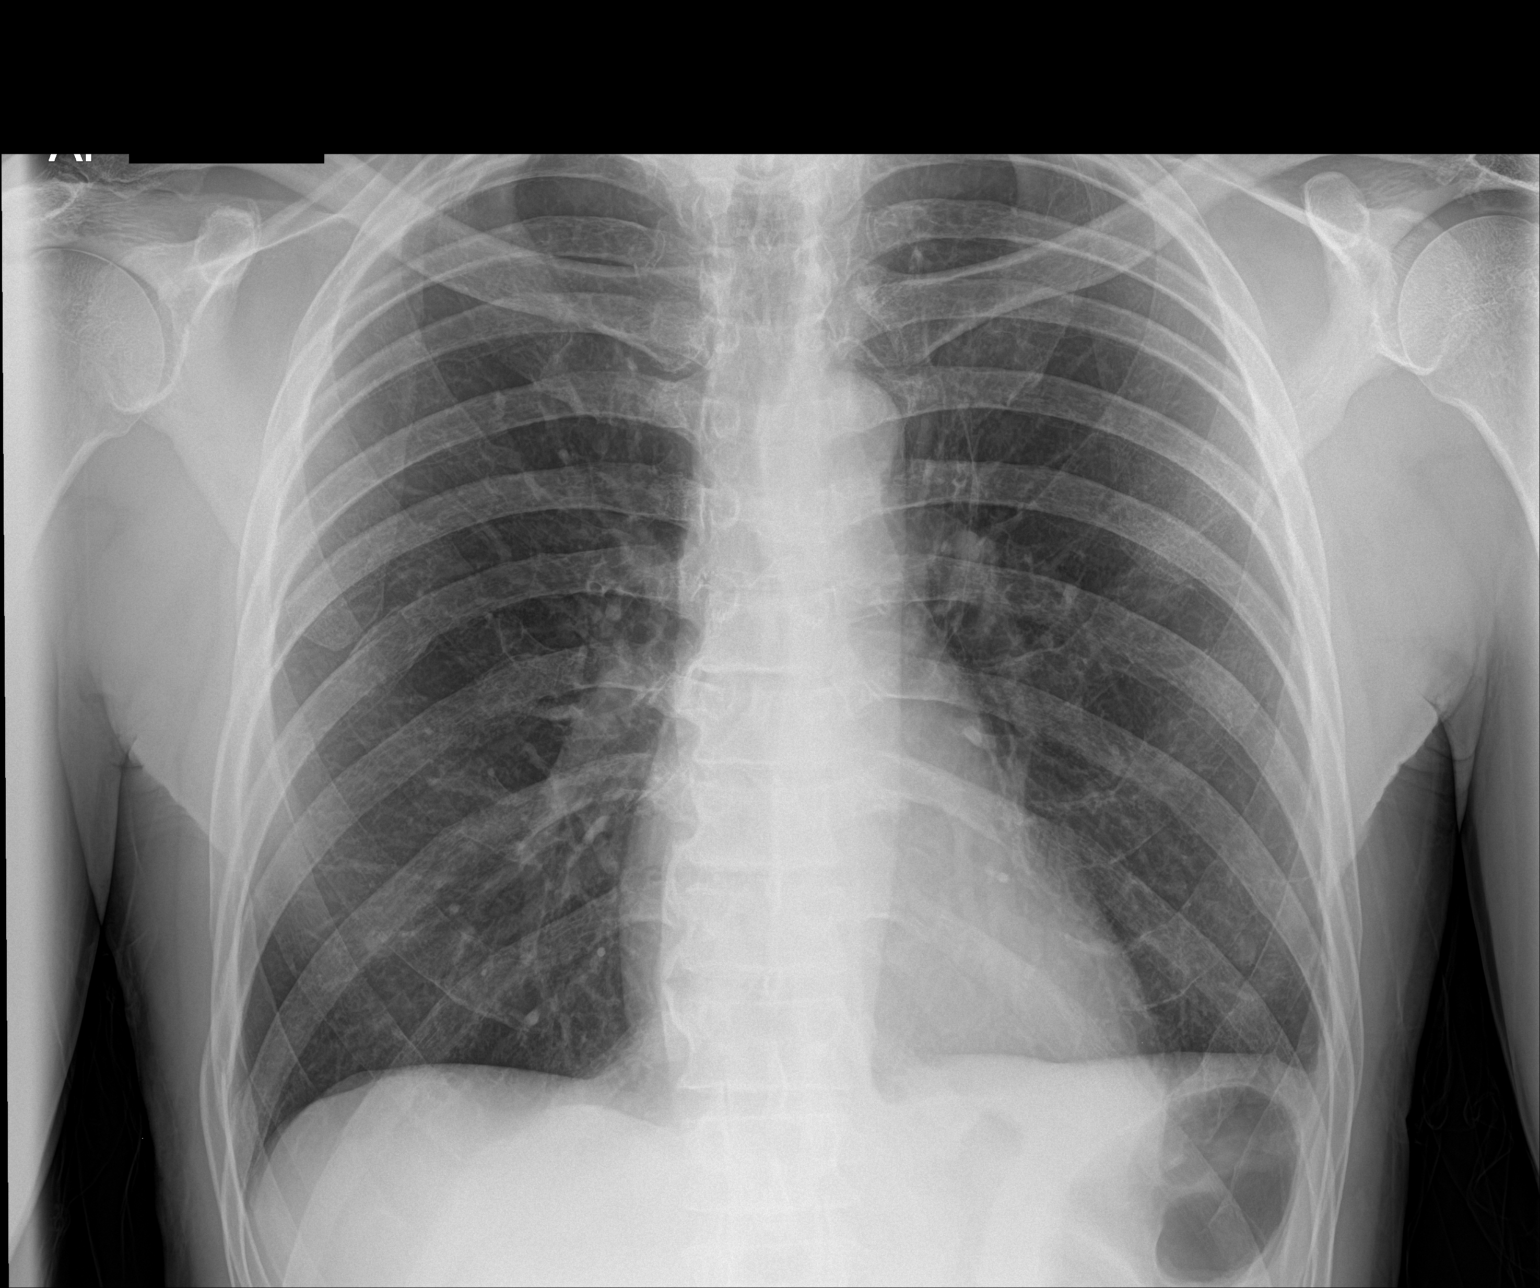

[2 of 2 positions shown; findings below may reference images not displayed]

FINDINGS: Cardiomediastinal silhouette is stable. No acute infiltrate or
pleural effusion. No pulmonary edema. There is no pneumothorax. No
gross fractures are noted. Stable old fracture of the right eighth
rib. Probable old fracture of the right ninth and tenth rib.
Clinical correlation is necessary.
IMPRESSION: No acute infiltrate or pleural effusion. No pulmonary edema. There
is no pneumothorax. No gross fractures are noted. Stable old
fracture of the right eighth rib. Probable old fracture of the right
ninth and tenth rib. Clinical correlation is necessary.

## 2017-04-01 IMAGING — CR DG KNEE COMPLETE 4+V*L*
4 series · 4 of 4 positions shown · non-contrast
Comparison: None.

CLINICAL DATA: Pain following fall

EXAM:
LEFT KNEE - COMPLETE 4+ VIEW

[knee ap]
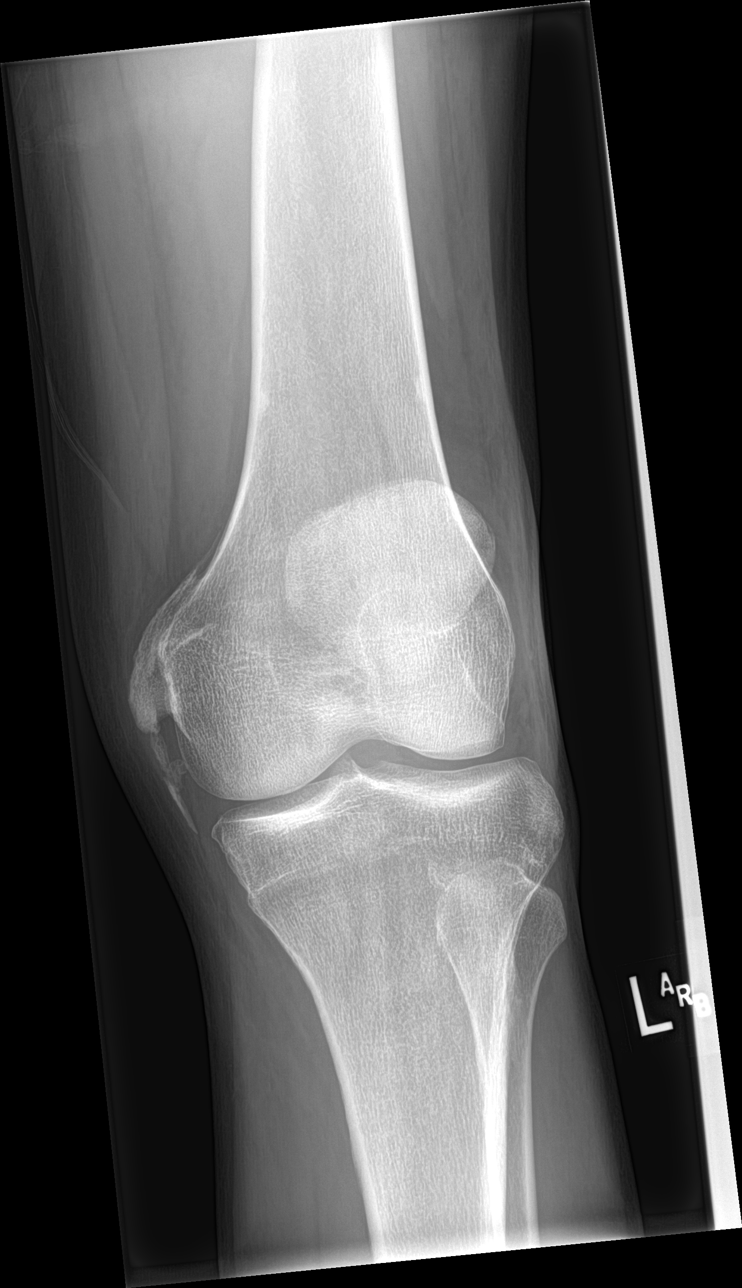

[knee obl (1 of 2)]
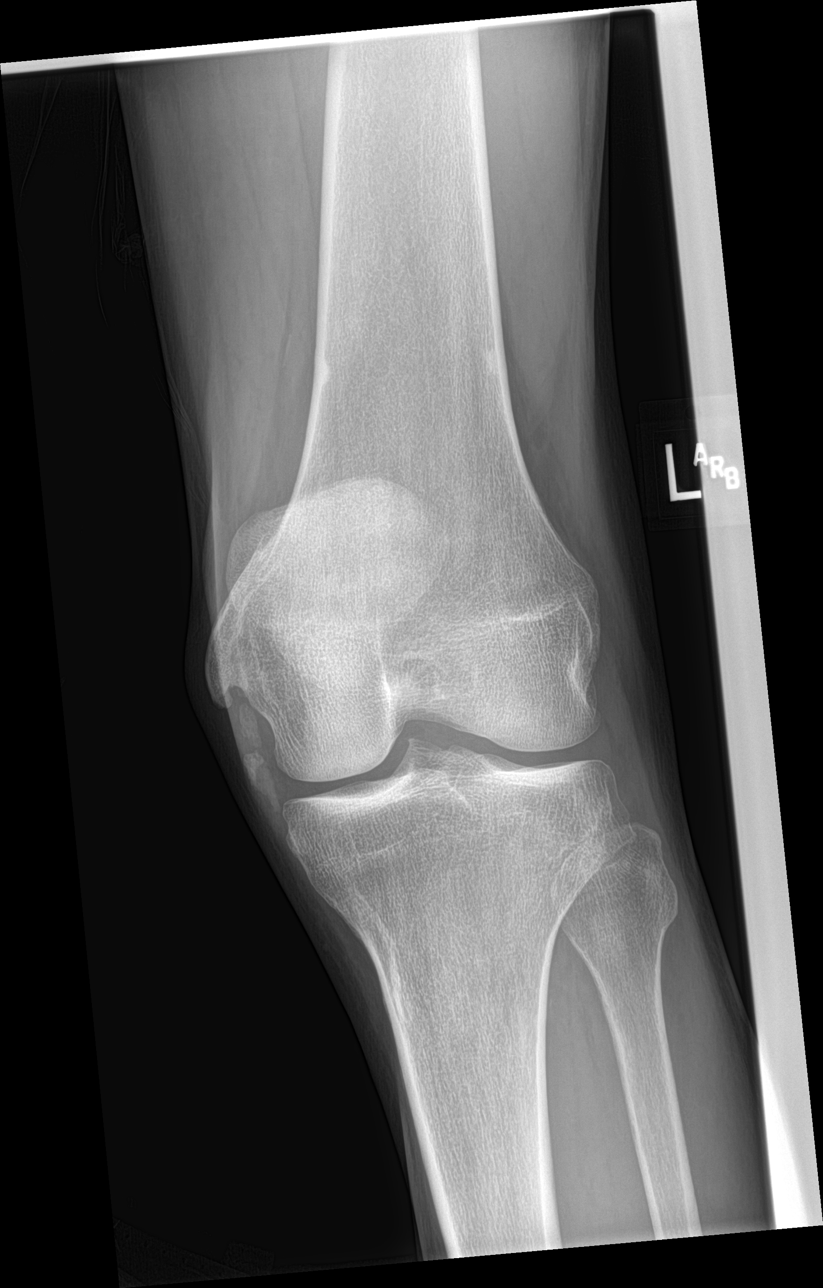

[knee obl (2 of 2)]
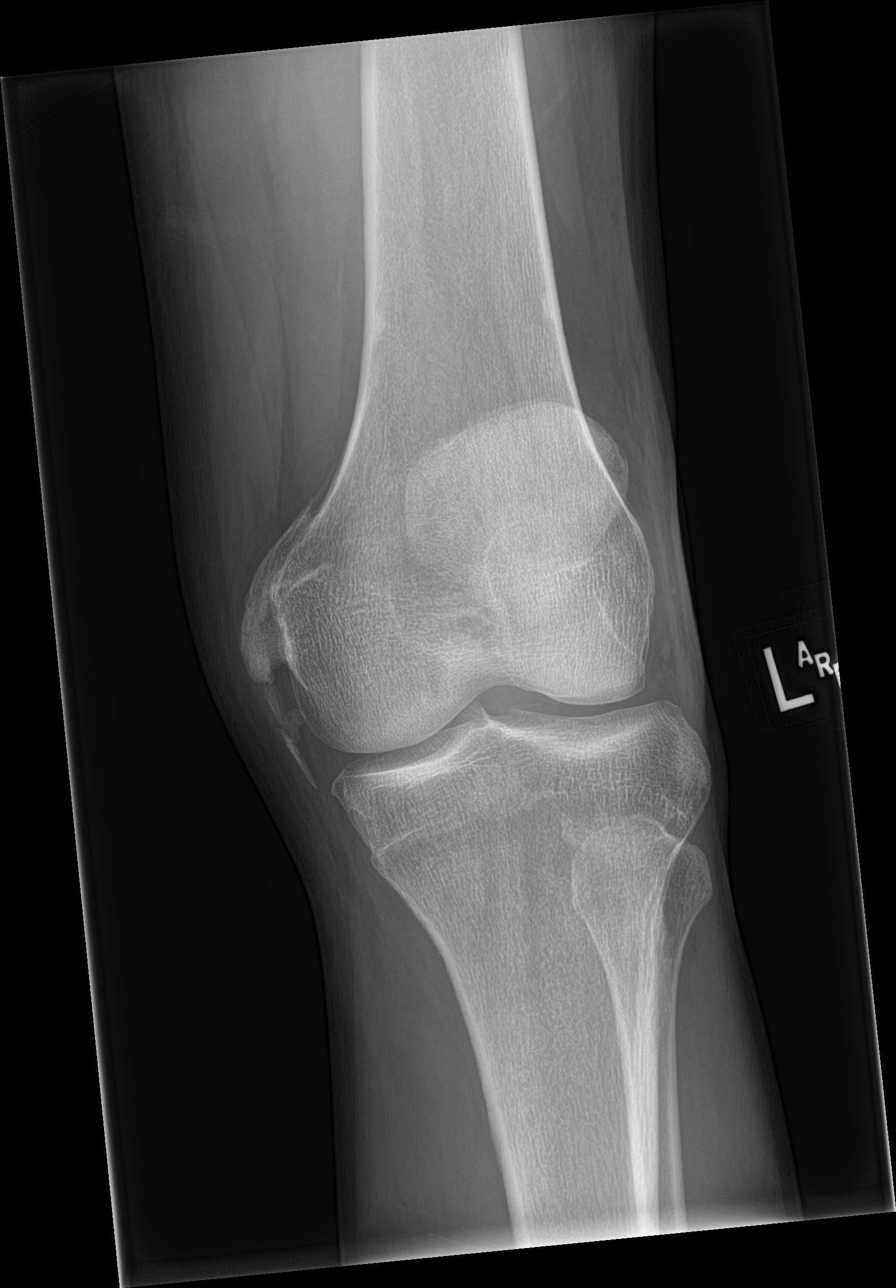

[knee lat]
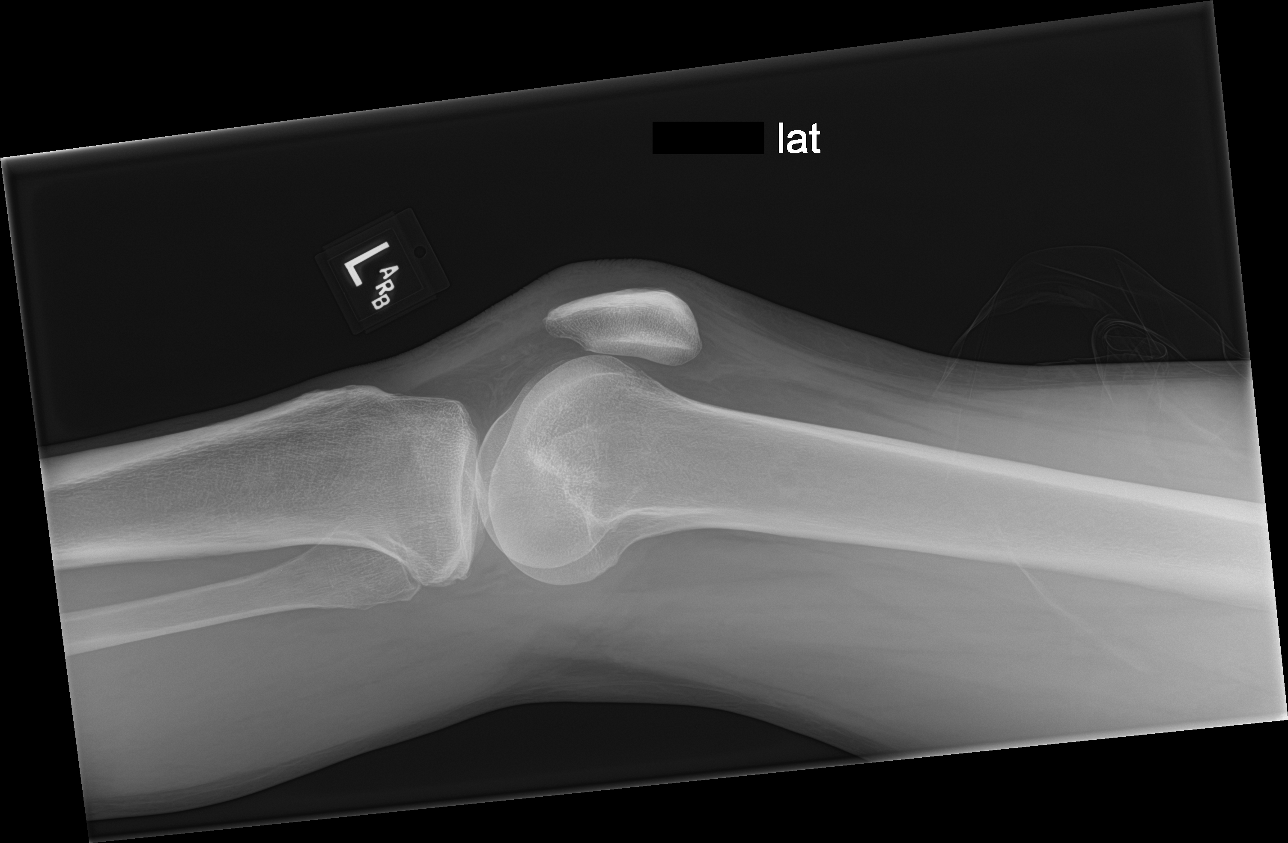

[4 of 4 positions shown; findings below may reference images not displayed]

FINDINGS: Frontal, bilateral oblique, and lateral views were obtained. There
is evidence of old trauma medial to the distal femoral metaphysis
with extensive bony remodeling. The changes in this area appear
chronic without obvious acute fracture in this area. No acute
appearing fracture is evident. There is myositis ossifications
medial to the distal femur. There is no appreciable dislocation.
There does, however, appear to be mild lateral patellar subluxation.
No effusion is evident. The joint spaces appear unremarkable. No
erosive change.
IMPRESSION: Mild lateral patellar subluxation. No dislocation. Evidence of old
trauma along the distal femur medially without acute fracture
evident. No appreciable arthropathic change. No joint effusion.

## 2018-05-20 ENCOUNTER — Emergency Department: Payer: Medicare HMO

## 2018-05-20 ENCOUNTER — Other Ambulatory Visit: Payer: Self-pay

## 2018-05-20 ENCOUNTER — Emergency Department
Admission: EM | Admit: 2018-05-20 | Discharge: 2018-05-20 | Disposition: A | Discharge status: Home / Self Care | Payer: Medicare HMO | Attending: Emergency Medicine | Admitting: Emergency Medicine

## 2018-05-20 ENCOUNTER — Encounter: Payer: Self-pay | Admitting: *Deleted

## 2018-05-20 DIAGNOSIS — Z8673 Personal history of transient ischemic attack (TIA), and cerebral infarction without residual deficits: Secondary | ICD-10-CM | POA: Insufficient documentation

## 2018-05-20 DIAGNOSIS — N390 Urinary tract infection, site not specified: Secondary | ICD-10-CM

## 2018-05-20 DIAGNOSIS — N3001 Acute cystitis with hematuria: Secondary | ICD-10-CM | POA: Diagnosis not present

## 2018-05-20 DIAGNOSIS — Z79899 Other long term (current) drug therapy: Secondary | ICD-10-CM | POA: Diagnosis not present

## 2018-05-20 DIAGNOSIS — F17210 Nicotine dependence, cigarettes, uncomplicated: Secondary | ICD-10-CM | POA: Insufficient documentation

## 2018-05-20 DIAGNOSIS — N3091 Cystitis, unspecified with hematuria: Secondary | ICD-10-CM

## 2018-05-20 DIAGNOSIS — F329 Major depressive disorder, single episode, unspecified: Secondary | ICD-10-CM | POA: Diagnosis not present

## 2018-05-20 DIAGNOSIS — R319 Hematuria, unspecified: Secondary | ICD-10-CM | POA: Diagnosis present

## 2018-05-20 DIAGNOSIS — Z8546 Personal history of malignant neoplasm of prostate: Secondary | ICD-10-CM | POA: Diagnosis not present

## 2018-05-20 DIAGNOSIS — I10 Essential (primary) hypertension: Secondary | ICD-10-CM | POA: Diagnosis not present

## 2018-05-20 LAB — COMPREHENSIVE METABOLIC PANEL
ALT: 15 U/L (ref 0–44)
AST: 25 U/L (ref 15–41)
Albumin: 4 g/dL (ref 3.5–5.0)
Alkaline Phosphatase: 77 U/L (ref 38–126)
Anion gap: 14 (ref 5–15)
BUN: 13 mg/dL (ref 8–23)
CHLORIDE: 104 mmol/L (ref 98–111)
CO2: 25 mmol/L (ref 22–32)
Calcium: 9.5 mg/dL (ref 8.9–10.3)
Creatinine, Ser: 1.03 mg/dL (ref 0.61–1.24)
GFR calc non Af Amer: >60 mL/min (ref >60)
Glucose, Bld: 104 mg/dL — ABNORMAL HIGH (ref 70–99)
POTASSIUM: 3.3 mmol/L — AB (ref 3.5–5.1)
Sodium: 143 mmol/L (ref 135–145)
Total Bilirubin: 0.2 mg/dL — ABNORMAL LOW (ref 0.3–1.2)
Total Protein: 7.9 g/dL (ref 6.5–8.1)

## 2018-05-20 LAB — CBC
HEMATOCRIT: 50.1 % (ref 39.0–52.0)
Hemoglobin: 16 g/dL (ref 13.0–17.0)
MCH: 25.3 pg — ABNORMAL LOW (ref 26.0–34.0)
MCHC: 31.9 g/dL (ref 30.0–36.0)
MCV: 79.3 fL — ABNORMAL LOW (ref 80.0–100.0)
NRBC: 0 % (ref 0.0–0.2)
Platelets: 205 10*3/uL (ref 150–400)
RBC: 6.32 MIL/uL — ABNORMAL HIGH (ref 4.22–5.81)
RDW: 18.2 % — AB (ref 11.5–15.5)
WBC: 5.9 10*3/uL (ref 4.0–10.5)

## 2018-05-20 LAB — URINALYSIS, COMPLETE (UACMP) WITH MICROSCOPIC
Bilirubin Urine: NEGATIVE
GLUCOSE, UA: NEGATIVE mg/dL
Ketones, ur: 5 mg/dL — AB
NITRITE: POSITIVE — AB
PH: 5 (ref 5.0–8.0)
PROTEIN: 100 mg/dL — AB
RBC / HPF: >50 RBC/hpf — ABNORMAL HIGH (ref 0–5)
SPECIFIC GRAVITY, URINE: 1.028 (ref 1.005–1.030)
WBC, UA: >50 WBC/hpf — ABNORMAL HIGH (ref 0–5)

## 2018-05-20 MED ORDER — CEPHALEXIN 500 MG PO CAPS: 500.0000 mg | ORAL_CAPSULE | Freq: Three times a day (TID) | ORAL | 0 refills | Status: AC

## 2018-05-20 MED ORDER — SODIUM CHLORIDE 0.9 % IV SOLN
1.0000 g | Freq: Once | INTRAVENOUS | Status: AC
  Administered 2018-05-20: 1 g via INTRAVENOUS
  Filled 2018-05-20: qty 10

## 2018-05-20 NOTE — ED Provider Notes (Addendum)
[facility] Emergency Department Provider Note  ____________________________________________  Time seen: Approximately 8:53 PM  I have reviewed the triage vital signs and the nursing notes.   HISTORY  Chief Complaint Hematuria   HPI Roger [name] is a 67 y.o. male the history of prostate cancer, hypertension who presents for evaluation of hematuria.  Patient reports a week of dysuria and abdominal pain.  This morning he noticed gross hematuria.  He denies any prior episodes of gross hematuria.  He is not on any blood thinners.  Is complaining of pain that is located in the left side of his abdomen radiating to his left lower back and left leg and also to his left groin.  Pain is constant and currently 8 out of 10.  Has been taking ibuprofen and Tylenol at home with some help.  Also endorses night sweats and chills.  He is unsure if he has had any fever.  Has had decreased appetite. Patient not sure if he is currently undergoing any treatment for his prostate cancer. Reports rectal pressure with BM as well for the last week.   Past Medical History:  Diagnosis Date  . Arthritis   . Depression   . Hypertension   . Pneumonia 2014   Required chest tube placement  . Prostate cancer ([place])   . Seasonal allergies   . Stroke (cerebrum) [facility])     Patient Active Problem List   Diagnosis Date Noted  . Abnormal CT of the abdomen 10/09/2016  . Prostate CA ([place]) 10/09/2016    Past Surgical History:  Procedure Laterality Date  . COLONOSCOPY N/A 10/13/2016   Procedure: COLONOSCOPY;  Surgeon: Danie Binder, MD;  Location: [facility] ENDO SUITE;  Service: Endoscopy;  Laterality: N/A;  2:30pm - ok per [place]  . PROSTATE BIOPSY      Prior to Admission medications   Medication Sig Start Date End Date Taking? Authorizing Provider  amLODipine (NORVASC) 10 MG tablet Take 10 mg by mouth daily.    [provider]  beclomethasone (QVAR) 80 MCG/ACT inhaler Inhale 1 puff  into the lungs 2 (two) times daily.    [provider]  cephALEXin (KEFLEX) 500 MG capsule Take 1 capsule (500 mg total) by mouth 3 (three) times daily for 7 days. 05/20/18 05/27/18  Rudene Re, MD  fluticasone [facility]) 50 MCG/ACT nasal spray Place 1 spray into both nostrils daily. 09/17/16   [provider]  folic acid (FOLVITE) 1 MG tablet Take 1 mg by mouth daily.    [provider]  gabapentin (NEURONTIN) 300 MG capsule Take 300 mg by mouth 2 (two) times daily.    [provider]  ibuprofen (ADVIL,MOTRIN) 600 MG tablet Take 1 tablet by mouth every 8 (eight) hours as needed for mild pain or moderate pain.  12/14/09   [provider]  loratadine (CLARITIN) 10 MG tablet Take 10 mg by mouth daily.    [provider]  mirtazapine (REMERON) 15 MG tablet Take 15 mg by mouth at bedtime.  10/07/16   [provider]  Multiple Vitamin (MULTI-VITAMINS) TABS Take 1 tablet by mouth daily.    [provider]  omeprazole (PRILOSEC) 40 MG capsule Take 1 capsule by mouth daily. 06/16/08   [provider]  PROAIR HFA 108 (90 Base) MCG/ACT inhaler Take 2 puffs by mouth every 4 (four) hours as needed. 09/17/16   [provider]  tamsulosin (FLOMAX) 0.4 MG CAPS capsule Take 1 capsule by mouth daily.  10/07/16   [provider]  thiamine (VITAMIN B-1) 100 MG tablet Take 100 mg by mouth daily.    [provider]  zinc sulfate 220 (50 Zn) MG capsule Take 220 mg by mouth daily.    [provider]    Allergies Penicillins and Aspirin  Family History  Problem Relation Age of Onset  . Heart attack Mother   . Diabetes Sister   . CAD Sister   . Kidney failure Brother     Social History Social History   Tobacco Use  . Smoking status: Current Every Day Smoker    Packs/day: 0.50    Years: 30.00    Pack years: 15.00    Types: Cigarettes  . Smokeless tobacco: Never Used  Substance Use Topics  .  Alcohol use: Yes    Comment: 4 cans of beer per week  . Drug use: No    Review of Systems  Constitutional: Negative for fever. Eyes: Negative for visual changes. ENT: Negative for sore throat. Neck: No neck pain  Cardiovascular: Negative for chest pain. Respiratory: Negative for shortness of breath. Gastrointestinal: + L sided abdominal pain. No vomiting or diarrhea. Genitourinary: + dysuria, hematuria, frequency Musculoskeletal: Negative for back pain. Skin: Negative for rash. Neurological: Negative for headaches, weakness or numbness. Psych: No SI or HI  ____________________________________________   PHYSICAL EXAM:  VITAL SIGNS: ED Triage Vitals  Enc Vitals Group     BP 05/20/18 1750 136/78     Pulse Rate 05/20/18 1750 (!) 107     Resp 05/20/18 1750 20     Temp 05/20/18 1750 98.2 F (36.8 C)     Temp Source 05/20/18 1750 Oral     SpO2 05/20/18 1750 95 %     Weight 05/20/18 1751 175 lb (79.4 kg)     Height 05/20/18 1751 5\' 10"  (1.778 m)     Head Circumference --      Peak Flow --      Pain Score 05/20/18 1751 7     Pain Loc --      Pain Edu? --      Excl. in [place]? --     Constitutional: Alert and oriented, patient smells strongly of urine. HEENT:      Head: Normocephalic and atraumatic.         Eyes: Conjunctivae are normal. Sclera is non-icteric.       Mouth/Throat: Mucous membranes are moist.       Neck: Supple with no signs of meningismus. Cardiovascular: Regular rate and rhythm. No murmurs, gallops, or rubs. 2+ symmetrical distal pulses are present in all extremities. No JVD. Respiratory: Normal respiratory effort. Lungs are clear to auscultation bilaterally. No wheezes, crackles, or rhonchi.  Gastrointestinal: Soft, non tender, and non distended with positive bowel sounds. No rebound or guarding. Genitourinary: No CVA tenderness.  Prostate is firm with no tenderness. Bilateral testicles are descended with no tenderness to palpation, bilateral positive  cremasteric reflexes are present, no swelling or erythema of the scrotum. No evidence of inguinal hernia. Musculoskeletal: Nontender with normal range of motion in all extremities. No edema, cyanosis, or erythema of extremities. Neurologic: Normal speech and language. Face is symmetric. Moving all extremities. No gross focal neurologic deficits are appreciated. Skin: Skin is warm, dry and intact. No rash noted. Psychiatric: Mood and affect are normal. Speech and behavior are normal.  ____________________________________________   LABS (all labs ordered are listed, but only abnormal results are displayed)  Labs Reviewed  COMPREHENSIVE METABOLIC  PANEL - Abnormal; Notable for the following components:      Result Value   Potassium 3.3 (*)    Glucose, Bld 104 (*)    Total Bilirubin 0.2 (*)    All other components within normal limits  CBC - Abnormal; Notable for the following components:   RBC 6.32 (*)    MCV 79.3 (*)    MCH 25.3 (*)    RDW 18.2 (*)    All other components within normal limits  URINALYSIS, COMPLETE (UACMP) WITH MICROSCOPIC - Abnormal; Notable for the following components:   Color, Urine AMBER (*)    APPearance CLOUDY (*)    Hgb urine dipstick LARGE (*)    Ketones, ur 5 (*)    Protein, ur 100 (*)    Nitrite POSITIVE (*)    Leukocytes, UA MODERATE (*)    RBC / HPF >50 (*)    WBC, UA >50 (*)    Bacteria, UA FEW (*)    All other components within normal limits  URINE CULTURE   ____________________________________________  EKG  none  ____________________________________________  RADIOLOGY  I have personally reviewed the images performed during this visit and I agree with the Radiologist's read.   Interpretation by Radiologist:  Ct Renal Stone Study  Result Date: 05/20/2018 CLINICAL DATA:  Hematuria EXAM: CT ABDOMEN AND PELVIS WITHOUT CONTRAST TECHNIQUE: Multidetector CT imaging of the abdomen and pelvis was performed following the standard protocol  without IV contrast. COMPARISON:  Pelvis x-ray 11/12/2015 FINDINGS: Lower chest: Lung bases demonstrate no acute consolidation or effusion. The heart size is normal Hepatobiliary: No focal liver abnormality is seen. No gallstones, gallbladder wall thickening, or biliary dilatation. Pancreas: Unremarkable. No pancreatic ductal dilatation or surrounding inflammatory changes. Spleen: Normal in size without focal abnormality. Adrenals/Urinary Tract: Adrenal glands are unremarkable. Kidneys are normal, without renal calculi, focal lesion, or hydronephrosis. Bladder is unremarkable. Stomach/Bowel: Stomach is within normal limits. Appendix appears normal. No evidence of bowel wall thickening, distention, or inflammatory changes. Vascular/Lymphatic: Nonaneurysmal aorta. Mild aortic atherosclerosis. No significantly enlarged lymph nodes. Reproductive: No mass.  Metallic densities at the prostate. Other: Negative for free air or free fluid. Musculoskeletal: Old left inferior pubic ramus fracture. Surgical plate and multiple screw fixation of old left acetabular fracture with prominent heterotopic ossification. IMPRESSION: 1. Negative for hydronephrosis or ureteral stone 2. No CT evidence for acute intra-abdominal or pelvic abnormality Electronically Signed   By: Donavan Foil M.D.   On: 05/20/2018 20:59     ____________________________________________   PROCEDURES  Procedure(s) performed: None Procedures Critical Care performed:  None ____________________________________________   INITIAL IMPRESSION / ASSESSMENT AND PLAN / ED COURSE   67 y.o. male the history of prostate cancer, hypertension who presents for evaluation of hematuria in the setting of 1 week of urinary frequency and dysuria, also with abdominal pain located on the left side and radiated to his left back.  CT was done to rule out a kidney stone which is negative.  UA is positive for UTI.  There is no evidence of urinary retention at this time.   It is unclear if patient is still receiving treatment for his prostate cancer.  Review of epic seems that patient has not been seen by radiation oncology since March 2019.  Patient is not sure if he is on oral medications for his cancer.  He has no fever or signs of sepsis otherwise.  White count is normal at 5.9.  Patient is not on blood thinners and has stable  hemoglobin at 16.  Prostate exam was done to evaluate for prostatitis since patient was complaining of rectal pressure for the last 2 weeks.  Prostate is normal, firm and nontender otherwise.  Will give a dose of IV Rocephin and send urine for culture.  No evidence of urinary retention with PVR of 0cc. Urine has cleared in the ED and looks yellow. Anticipate discharge home on antibiotics and close follow-up with primary care doctor.  I discussed signs and symptoms of urinary retention with the patient, acute blood loss anemia, pyelonephritis, and sepsis and recommended return to the emergency room if these develop.     As part of my medical decision making, I reviewed the following data within the [place] notes reviewed and incorporated, Labs reviewed , Old chart reviewed, Radiograph reviewed , Notes from prior ED visits and [place] Controlled Substance Database    Pertinent labs & imaging results that were available during my care of the patient were reviewed by me and considered in my medical decision making (see chart for details).    ____________________________________________   FINAL CLINICAL IMPRESSION(S) / ED DIAGNOSES  Final diagnoses:  Hemorrhagic cystitis  Lower urinary tract infectious disease      NEW MEDICATIONS STARTED DURING THIS VISIT:  ED Discharge Orders         Ordered    cephALEXin (KEFLEX) 500 MG capsule  3 times daily     05/20/18 2203           Note:  This document was prepared using Dragon voice recognition software and may include unintentional dictation errors.      Rudene Re, MD 05/20/18 [place], [place], MD 05/20/18 [place], [place], MD 05/20/18 2206

## 2018-05-20 NOTE — ED Triage Notes (Signed)
Pt reports blood in urine and dysuria.   Hx prostate cancer.  Sx began 1 week ago.   Pt also has lower back pain.  Pt alert.

## 2018-05-20 NOTE — Discharge Instructions (Addendum)

## 2018-05-22 LAB — URINE CULTURE: Culture: <10000 — AB

## 2019-05-08 ENCOUNTER — Other Ambulatory Visit: Payer: Self-pay

## 2019-05-08 DIAGNOSIS — F10129 Alcohol abuse with intoxication, unspecified: Secondary | ICD-10-CM | POA: Diagnosis not present

## 2019-05-08 DIAGNOSIS — Z79899 Other long term (current) drug therapy: Secondary | ICD-10-CM | POA: Diagnosis not present

## 2019-05-08 DIAGNOSIS — Z8546 Personal history of malignant neoplasm of prostate: Secondary | ICD-10-CM | POA: Diagnosis not present

## 2019-05-08 DIAGNOSIS — I10 Essential (primary) hypertension: Secondary | ICD-10-CM | POA: Insufficient documentation

## 2019-05-08 DIAGNOSIS — R0789 Other chest pain: Secondary | ICD-10-CM | POA: Insufficient documentation

## 2019-05-08 DIAGNOSIS — F17210 Nicotine dependence, cigarettes, uncomplicated: Secondary | ICD-10-CM | POA: Insufficient documentation

## 2019-05-08 DIAGNOSIS — G8929 Other chronic pain: Secondary | ICD-10-CM | POA: Diagnosis not present

## 2019-05-08 DIAGNOSIS — R319 Hematuria, unspecified: Secondary | ICD-10-CM | POA: Insufficient documentation

## 2019-05-08 DIAGNOSIS — M25561 Pain in right knee: Secondary | ICD-10-CM | POA: Insufficient documentation

## 2019-05-08 MED ORDER — SODIUM CHLORIDE 0.9% FLUSH: 3.0000 mL | Freq: Once | INTRAVENOUS | Status: DC

## 2019-05-08 NOTE — ED Triage Notes (Signed)
Per ems, pleuritic chest pain, hurts when he breathes, blood in his urine, bilateral knee pain with difficulty ambulation. NS on the ekg, all vss. Pt states his knee gave out tonight. Pt says his chest is hurting and he feels as if he is "fixing to have a heart attack". Pt reports he has a hx of 2 strokes.

## 2019-05-08 NOTE — ED Notes (Signed)
Pt is intoxicated and per ems pt jumps from hospital to hospital for treatment as told by roommate.

## 2019-05-09 ENCOUNTER — Emergency Department
Admission: EM | Admit: 2019-05-09 | Discharge: 2019-05-09 | Disposition: A | Discharge status: Home / Self Care | Payer: Medicare HMO | Attending: Emergency Medicine | Admitting: Emergency Medicine

## 2019-05-09 ENCOUNTER — Emergency Department: Payer: Medicare HMO

## 2019-05-09 DIAGNOSIS — F1010 Alcohol abuse, uncomplicated: Secondary | ICD-10-CM

## 2019-05-09 DIAGNOSIS — R0789 Other chest pain: Secondary | ICD-10-CM | POA: Diagnosis not present

## 2019-05-09 DIAGNOSIS — G8929 Other chronic pain: Secondary | ICD-10-CM

## 2019-05-09 DIAGNOSIS — N3001 Acute cystitis with hematuria: Secondary | ICD-10-CM

## 2019-05-09 LAB — URINALYSIS, COMPLETE (UACMP) WITH MICROSCOPIC
Bilirubin Urine: NEGATIVE
Glucose, UA: NEGATIVE mg/dL
Ketones, ur: NEGATIVE mg/dL
Nitrite: POSITIVE — AB
Protein, ur: 100 mg/dL — AB
RBC / HPF: >50 RBC/hpf — ABNORMAL HIGH (ref 0–5)
Specific Gravity, Urine: 1.025 (ref 1.005–1.030)
Squamous Epithelial / HPF: NONE SEEN (ref 0–5)
WBC, UA: >50 WBC/hpf — ABNORMAL HIGH (ref 0–5)
pH: 5 (ref 5.0–8.0)

## 2019-05-09 LAB — ETHANOL: Alcohol, Ethyl (B): 306 mg/dL (ref <10)

## 2019-05-09 LAB — BASIC METABOLIC PANEL
Anion gap: 14 (ref 5–15)
BUN: 8 mg/dL (ref 8–23)
CO2: 25 mmol/L (ref 22–32)
Calcium: 9.2 mg/dL (ref 8.9–10.3)
Chloride: 106 mmol/L (ref 98–111)
Creatinine, Ser: 0.79 mg/dL (ref 0.61–1.24)
GFR calc Af Amer: >60 mL/min (ref >60)
GFR calc non Af Amer: >60 mL/min (ref >60)
Glucose, Bld: 97 mg/dL (ref 70–99)
Potassium: 4.3 mmol/L (ref 3.5–5.1)
Sodium: 145 mmol/L (ref 135–145)

## 2019-05-09 LAB — CBC
HCT: 48.5 % (ref 39.0–52.0)
Hemoglobin: 15.6 g/dL (ref 13.0–17.0)
MCH: 24.6 pg — ABNORMAL LOW (ref 26.0–34.0)
MCHC: 32.2 g/dL (ref 30.0–36.0)
MCV: 76.6 fL — ABNORMAL LOW (ref 80.0–100.0)
Platelets: 346 10*3/uL (ref 150–400)
RBC: 6.33 MIL/uL — ABNORMAL HIGH (ref 4.22–5.81)
RDW: 15.3 % (ref 11.5–15.5)
WBC: 6.7 10*3/uL (ref 4.0–10.5)
nRBC: 0 % (ref 0.0–0.2)

## 2019-05-09 LAB — TROPONIN I (HIGH SENSITIVITY)
Troponin I (High Sensitivity): 6 ng/L (ref <18)
Troponin I (High Sensitivity): 7 ng/L (ref <18)

## 2019-05-09 MED ORDER — LIDOCAINE VISCOUS HCL 2 % MT SOLN
15.0000 mL | Freq: Once | OROMUCOSAL | Status: AC
  Administered 2019-05-09: 03:00:00 15 mL via ORAL
  Filled 2019-05-09: qty 15

## 2019-05-09 MED ORDER — ACETAMINOPHEN 500 MG PO TABS
1000.0000 mg | ORAL_TABLET | Freq: Once | ORAL | Status: AC
  Administered 2019-05-09: 03:00:00 1000 mg via ORAL
  Filled 2019-05-09: qty 2

## 2019-05-09 MED ORDER — CEPHALEXIN 500 MG PO CAPS: 500.0000 mg | ORAL_CAPSULE | Freq: Two times a day (BID) | ORAL | 0 refills | Status: DC

## 2019-05-09 MED ORDER — CEPHALEXIN 500 MG PO CAPS: 500.0000 mg | ORAL_CAPSULE | Freq: Two times a day (BID) | ORAL | 0 refills | Status: AC

## 2019-05-09 MED ORDER — ALUM & MAG HYDROXIDE-SIMETH 200-200-20 MG/5ML PO SUSP
30.0000 mL | Freq: Once | ORAL | Status: AC
  Administered 2019-05-09: 03:00:00 30 mL via ORAL
  Filled 2019-05-09: qty 30

## 2019-05-09 NOTE — ED Notes (Signed)
Dr Alfred Levins in to see pt

## 2019-05-09 NOTE — ED Notes (Signed)
In to check on pt; appeared to be sleeping, then coughed twice; opened eyes and realized I was in the room; pt says his chest pain "Is hurting real bad now"; understands nothing else ordered for pain at this time; pt closed eyes and nodded back off to sleep

## 2019-05-09 NOTE — ED Notes (Signed)
Pt shouting out for pain medication; officer with behavioral pts lets pt know staff will be with him ASAP; pt quieted down and verbalized understanding

## 2019-05-09 NOTE — ED Notes (Signed)
To assisted to toilet. Pt states he is unable to walk or place any weight on the right leg. When attempting to use walker pt states he couldn't walk. Pt reported blood in urine after going to toilet. Pt also had a bowel movement at this time. When asked if he was sure the blood was from urine, he reported that it was from the urine. Pt reports blood in urine for the past several months, ever since he had chemotherapy. Pt also reports right sided lower and mid abdominal pain. Pt was wearing soiled clothing. Pt changed into blue scrub pants at this time.

## 2019-05-09 NOTE — ED Notes (Signed)
Pt reports bilateral leg pain for about 6 months and chest pain to the center of his chest for "longer"; pt in no acute distress;

## 2019-05-09 NOTE — ED Provider Notes (Signed)
[facility] Emergency Department Provider Note  ____________________________________________  Time seen: Approximately 1:45 AM  I have reviewed the triage vital signs and the nursing notes.   HISTORY  Chief Complaint Chest Pain   HPI Roger [name] is a 68 y.o. male with a history of stroke, prostate cancer on remission, hypertension, alcohol abuse who presents for evaluation of knee pain and chest pain.  Patient reports that he has had chest pain continuously for at least 9 months.  He describes the pain as sharp located in the center of his chest, worse when he tries to eat or drink something.  He is also complaining of right knee pain which she has had for  1 month after a fall.  The pain is unchanged from prior.  He reports that today his right knee gave out and he fell on the floor which prompted him to call 911.  Patient is not very forthcoming when he comes to his alcohol use.  Reports having one beer here and there occasionally.  When confronted with his alcohol level here patient reports that he had several beers today.  He denies unintentional weight loss, melena, vomiting, hematochezia, hematemesis, abdominal pain, cough, shortness of breath or fever.  Past Medical History:  Diagnosis Date  . Arthritis   . Depression   . Hypertension   . Pneumonia 2014   Required chest tube placement  . Prostate cancer ([place])   . Seasonal allergies   . Stroke (cerebrum) [facility])     Patient Active Problem List   Diagnosis Date Noted  . Abnormal CT of the abdomen 10/09/2016  . Prostate CA ([place]) 10/09/2016    Past Surgical History:  Procedure Laterality Date  . COLONOSCOPY N/A 10/13/2016   Procedure: COLONOSCOPY;  Surgeon: Danie Binder, MD;  Location: [facility];  Service: Endoscopy;  Laterality: N/A;  2:30pm - ok per Cleo  . PROSTATE BIOPSY      Prior to Admission medications   Medication Sig Start Date End Date Taking? Authorizing Provider   amLODipine (NORVASC) 10 MG tablet Take 10 mg by mouth daily.    [provider]  beclomethasone (QVAR) 80 MCG/ACT inhaler Inhale 1 puff into the lungs 2 (two) times daily.    [provider]  fluticasone (FLONASE) 50 MCG/ACT nasal spray Place 1 spray into both nostrils daily. 09/17/16   [provider]  folic acid (FOLVITE) 1 MG tablet Take 1 mg by mouth daily.    [provider]  gabapentin (NEURONTIN) 300 MG capsule Take 300 mg by mouth 2 (two) times daily.    [provider]  ibuprofen (ADVIL,MOTRIN) 600 MG tablet Take 1 tablet by mouth every 8 (eight) hours as needed for mild pain or moderate pain.  12/14/09   [provider]  loratadine (CLARITIN) 10 MG tablet Take 10 mg by mouth daily.    [provider]  mirtazapine (REMERON) 15 MG tablet Take 15 mg by mouth at bedtime.  10/07/16   [provider]  Multiple Vitamin (MULTI-VITAMINS) TABS Take 1 tablet by mouth daily.    [provider]  omeprazole (PRILOSEC) 40 MG capsule Take 1 capsule by mouth daily. 06/16/08   [provider]  PROAIR HFA 108 (90 Base) MCG/ACT inhaler Take 2 puffs by mouth every 4 (four) hours as needed. 09/17/16   [provider]  tamsulosin (FLOMAX) 0.4 MG CAPS capsule Take 1 capsule by mouth daily. 10/07/16   [provider]  thiamine (VITAMIN B-1) 100 MG tablet Take 100 mg by mouth daily.    [provider]  zinc sulfate 220 (50 Zn) MG capsule Take 220 mg by mouth daily.    [provider]    Allergies Penicillins and Aspirin  Family History  Problem Relation Age of Onset  . Heart attack Mother   . Diabetes Sister   . CAD Sister   . Kidney failure Brother     Social History Social History   Tobacco Use  . Smoking status: Current Every Day Smoker    Packs/day: 0.50    Years: 30.00    Pack years: 15.00    Types: Cigarettes  . Smokeless tobacco: Never Used  Substance Use Topics  .  Alcohol use: Yes    Comment: 4 cans of beer per week  . Drug use: No    Review of Systems  Constitutional: Negative for fever. Eyes: Negative for visual changes. ENT: Negative for sore throat. Neck: No neck pain  Cardiovascular: + chest pain. Respiratory: Negative for shortness of breath. Gastrointestinal: Negative for abdominal pain, vomiting or diarrhea. Genitourinary: Negative for dysuria. Musculoskeletal: Negative for back pain. + R knee pain Skin: Negative for rash. Neurological: Negative for headaches, weakness or numbness. Psych: No SI or HI  ____________________________________________   PHYSICAL EXAM:  VITAL SIGNS: ED Triage Vitals  Enc Vitals Group     BP 05/08/19 2343 (!) 142/79     Pulse Rate 05/08/19 2343 85     Resp 05/08/19 2343 18     Temp 05/08/19 2343 97.8 F (36.6 C)     Temp Source 05/08/19 2343 Oral     SpO2 05/08/19 2343 97 %     Weight --      Height --      Head Circumference --      Peak Flow --      Pain Score 05/08/19 2345 10     Pain Loc --      Pain Edu? --      Excl. in [place]? --     Constitutional: Alert and oriented. Well appearing and in no apparent distress. HEENT:      Head: Normocephalic and atraumatic.         Eyes: Conjunctivae are normal. Sclera is non-icteric.       Mouth/Throat: Mucous membranes are moist.       Neck: Supple with no signs of meningismus. Cardiovascular: Regular rate and rhythm. No murmurs, gallops, or rubs. 2+ symmetrical distal pulses are present in all extremities. No JVD. Respiratory: Normal respiratory effort. Lungs are clear to auscultation bilaterally. No wheezes, crackles, or rhonchi.  Gastrointestinal: Soft, non tender, and non distended with positive bowel sounds. No rebound or guarding. Genitourinary: No CVA tenderness. Musculoskeletal: Nontender with normal range of motion in all extremities. No edema, cyanosis, or erythema of extremities.  Full painless range of motion of the right knee with no  obvious deformity, abrasions, bruising, erythema, warmth, swelling Neurologic: Normal speech and language. Face is symmetric. Moving all extremities. No gross focal neurologic deficits are appreciated. Skin: Skin is warm, dry and intact. No rash noted. Psychiatric: Mood and affect are normal. Speech and behavior are normal.  ____________________________________________   LABS (all labs ordered are listed, but only abnormal results are displayed)  Labs Reviewed  CBC - Abnormal; Notable for the following components:      Result Value   RBC 6.33 (*)    MCV 76.6 (*)    [facility]  24.6 (*)    All other components within normal limits  ETHANOL - Abnormal; Notable for the following components:   Alcohol, Ethyl (B) 306 (*)    All other components within normal limits  BASIC METABOLIC PANEL  TROPONIN I (HIGH SENSITIVITY)  TROPONIN I (HIGH SENSITIVITY)   ____________________________________________  EKG  ED ECG REPORT I, Rudene Re, the attending physician, personally viewed and interpreted this ECG.  Normal sinus rhythm, rate of 90, normal intervals, normal axis, no ST elevations or depressions.  Unchanged from prior. ____________________________________________  RADIOLOGY  I have personally reviewed the images performed during this visit and I agree with the Radiologist's read.   Interpretation by Radiologist:  Dg Chest 2 View  Result Date: 05/09/2019 CLINICAL DATA:  Chest pain EXAM: CHEST - 2 VIEW COMPARISON:  11/12/2015 FINDINGS: No focal opacity or pleural effusion. Normal cardiomediastinal silhouette. No pneumothorax. Old right rib fracture. IMPRESSION: No active cardiopulmonary disease. Electronically Signed   By: Donavan Foil M.D.   On: 05/09/2019 00:36     ____________________________________________   PROCEDURES  Procedure(s) performed: None Procedures Critical Care performed:  None ____________________________________________   INITIAL IMPRESSION / ASSESSMENT  AND PLAN / ED COURSE   68 y.o. male with a history of stroke, prostate cancer on remission, hypertension, alcohol abuse who presents for evaluation of knee pain and chest pain.    #CP with swallowing for 9 months: ddx malignancy, PUD, GERD. EKG unchanged from baseline. Troponin x 2 negative. Patient underwent 3 CT a/p and 2 CT of the chest in 2020 which were all negative for acute pathology. Patient underwent swallow study 4 months ago which was also unremarkable. Patient needs a GI evaluation for endoscopy, will refer to GI. Labs with no evidence of dehydration, AKI, significant electrolyte abnormalities. Alcohol level 306. Discussed alcohol cessation with patient.  # knee pain: patient with trauma one month ago. 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  There is no signs of septic joint or any new trauma.  Will Ace wrap, recommended ice, will give walker.  Recommended Tylenol for pain.  Recommended follow-up with orthopedics.       As part of my medical decision making, I reviewed the following data within the [place] notes reviewed and incorporated, Labs reviewed , EKG interpreted , Old EKG reviewed, Old chart reviewed, Radiograph reviewed , Notes from prior ED visits and [place] Controlled Substance Database   Patient was evaluated in Emergency Department today for the symptoms described in the history of present illness. Patient was evaluated in the context of the global COVID-19 pandemic, which necessitated consideration that the patient might be at risk for infection with the SARS-CoV-2 virus that causes COVID-19. Institutional protocols and algorithms that pertain to the evaluation of patients at risk for COVID-19 are in a state of rapid change based on information released by regulatory bodies including the CDC and federal and state  organizations. These policies and algorithms were followed during the patient's care in the ED.   ____________________________________________   FINAL CLINICAL IMPRESSION(S) / ED DIAGNOSES   Final diagnoses:  Atypical chest pain  Alcohol abuse  Chronic pain of right knee      NEW MEDICATIONS STARTED DURING THIS VISIT:  ED Discharge Orders    None       Note:  This document was prepared using Dragon voice recognition software  and may include unintentional dictation errors.    Alfred Levins, [place], MD 05/09/19 0530

## 2019-05-09 NOTE — ED Notes (Signed)
Eyes closed, resp even and unlabored 

## 2019-05-09 NOTE — ED Notes (Signed)
ED tech in to draw repeat labs; informed pt nurse would be in as soon as possible with medications

## 2019-05-09 NOTE — ED Provider Notes (Addendum)
-----------------------------------------   8:41 AM on 05/09/2019 -----------------------------------------  Blood pressure (!) 144/77, pulse 96, temperature 97.8 F (36.6 C), temperature source Oral, resp. rate 18, SpO2 96 %.  Assuming care from Dr. Alfred Levins.  In short, Roger [name] is a 68 y.o. male with a chief complaint of Chest Pain .  Refer to the original H&P for additional details.  The current plan of care is to discharge home once clinically sober.  Patient now awake and alert, ambulating without difficulty.  He is appropriate for discharge home with PCP follow-up.    Patient now complaining of hematuria and dysuria, states this is been going on for "a long time".  Will screen UA, if no evidence of infection he may follow-up with urology.    Blake Divine, MD 05/09/19 [phone]  UA consistent with UTI, will prescribe Keflex.  Counseled patient to follow-up with PCP and return to the ED for new or worsening symptoms, patient agrees with plan.    Blake Divine, MD 05/09/19 1623

## 2019-05-09 NOTE — ED Notes (Signed)
Pt given coffee and graham crackers/peanut butter as allowed by MD; pt resting in bed watching tv

## 2019-05-11 LAB — URINE CULTURE

## 2019-05-27 ENCOUNTER — Other Ambulatory Visit: Payer: Self-pay

## 2019-05-27 ENCOUNTER — Encounter: Payer: Medicare HMO | Attending: Physician Assistant | Admitting: Physician Assistant

## 2019-05-27 DIAGNOSIS — F1020 Alcohol dependence, uncomplicated: Secondary | ICD-10-CM | POA: Insufficient documentation

## 2019-05-27 DIAGNOSIS — Z8249 Family history of ischemic heart disease and other diseases of the circulatory system: Secondary | ICD-10-CM | POA: Insufficient documentation

## 2019-05-27 DIAGNOSIS — F17200 Nicotine dependence, unspecified, uncomplicated: Secondary | ICD-10-CM | POA: Diagnosis not present

## 2019-05-27 DIAGNOSIS — Z8546 Personal history of malignant neoplasm of prostate: Secondary | ICD-10-CM | POA: Diagnosis not present

## 2019-05-27 DIAGNOSIS — Z923 Personal history of irradiation: Secondary | ICD-10-CM | POA: Diagnosis not present

## 2019-05-27 DIAGNOSIS — N3041 Irradiation cystitis with hematuria: Secondary | ICD-10-CM | POA: Insufficient documentation

## 2019-05-27 DIAGNOSIS — T148XXA Other injury of unspecified body region, initial encounter: Secondary | ICD-10-CM | POA: Diagnosis not present

## 2019-05-27 DIAGNOSIS — Z8673 Personal history of transient ischemic attack (TIA), and cerebral infarction without residual deficits: Secondary | ICD-10-CM | POA: Diagnosis not present

## 2019-05-27 DIAGNOSIS — X58XXXA Exposure to other specified factors, initial encounter: Secondary | ICD-10-CM | POA: Insufficient documentation

## 2019-05-27 DIAGNOSIS — Z841 Family history of disorders of kidney and ureter: Secondary | ICD-10-CM | POA: Diagnosis not present

## 2019-05-28 NOTE — Progress Notes (Signed)
GASPER, ISHIKAWA ([id number]) Visit Report for 05/27/2019 Chief Complaint Document Details Patient Name: Roger [name], Roger [name] Date of Service: 05/27/2019 2:15 PM Medical Record Number: [id number] Patient Account Number: [id number] Date of Birth/Sex: [birth year] (68 y.o. M) Treating RN: Montey Hora Primary Care Provider: Josephine Cables Other Clinician: Referring Provider: Cheral Almas Treating Provider/Extender: Melburn Hake, Eriana Suliman Weeks in Treatment: 0 Information Obtained from: Patient Chief Complaint Radiation cystitis Electronic Signature(s) Signed: 05/27/2019 3:09:18 PM By: Worthy Keeler PA-C Entered By: Worthy Keeler on 05/27/2019 15:09:17 Heinke, Roger [name] ([id number]) -------------------------------------------------------------------------------- HPI Details Patient Name: Roger [name] Date of Service: 05/27/2019 2:15 PM Medical Record Number: [id number] Patient Account Number: [id number] Date of Birth/Sex: Apr 09, 1951 (68 y.o. 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 As of this time I do not have the exact parameters that which he was treated as far as the dose of radiation nor the exact timeline although it was in 2018 that he was treated for prostate cancer by way of radiation. Unfortunately for the past several months he tells me has been having a lot of hematuria and also discomfort during the times when he is urinating sometimes the bleeding is quite significant. Obviously this is very frustrating for him. On top of this he is also had some issues recently with a nodule which was  noted in his lungs and apparently he has been referred to a pulmonologist to further evaluate this. With that being said he does not know who he is supposed to be seen. In fact he has not really been told at this point. Nonetheless it does sound like likely this will be pulmonology. He actually says that he just consumes alcohol 1 or 2 beers a couple times a week if he is having a lot of discomfort. With that being said he was recently in the hospital where he stated the same thing but was found unfortunately to have a critical alcohol level measuring out at 306. Obviously this seems to be quite a bit elevated compared to what his history would portray. This ER appointment was actually on 05/12/2019. Subsequently it was noted that he did have specifically a left apical nodular opacity slightly more prominent compared to 01/25/2019 and a CT scan was recommended. There did not appear to be any acute airspace disease according to the x-ray report. Thus with these findings I do believe that the patient is likely get a require further evaluation by pulmonology to be cleared for hyperbarics. I also think based on the fact that he does seem to be an alcoholic with significant findings on evaluation here that he is in a likely also need a ongoing monitoring with regard to his alcohol intake and likely would benefit from having air breaks if he does proceed with hyperbaric oxygen therapy. We do need to obtain records specifically on the doses of radiation he had as well as the dates more exactly for when he had the radiation in 2018. Electronic Signature(s) Signed: 05/27/2019 4:40:35 PM By: Worthy Keeler PA-C Entered By: Worthy Keeler on 05/27/2019 16:40:34 Roger [name], Roger [name] ([id number]) -------------------------------------------------------------------------------- Physical Exam Details Patient Name: Roger [name] Date of Service: 05/27/2019 2:15 PM  Medical Record Number: [id number] Patient  Account Number: [id number] Date of Birth/Sex: 16-Feb-1951 (68 y.o. M) Treating RN: Montey Hora Primary Care Provider: Josephine Cables Other Clinician: Referring Provider: Cheral Almas Treating Provider/Extender: Melburn Hake, Zarrah Loveland Weeks in Treatment: 0 Constitutional sitting or standing blood pressure is within target range for patient.. pulse regular and within target range for patient.Marland Kitchen respirations regular, non-labored and within target range for patient.Marland Kitchen temperature within target range for patient.. Well- nourished and well-hydrated in no acute distress. Eyes conjunctiva clear no eyelid edema noted. pupils equal round and reactive to light and accommodation. Ears, Nose, Mouth, and Throat no gross abnormality of ear auricles or external auditory canals. normal hearing noted during conversation. mucus membranes moist. Respiratory normal breathing without difficulty. clear to auscultation bilaterally. Cardiovascular regular rate and rhythm with normal S1, S2. 2+ dorsalis pedis/posterior tibialis pulses. no clubbing, cyanosis, significant edema, <3 sec cap refill. Gastrointestinal (GI) soft, non-tender, non-distended, +BS. no ventral hernia noted. Musculoskeletal normal gait and posture. no significant deformity or arthritic changes, no loss or range of motion, no clubbing. Psychiatric this patient is able to make decisions and demonstrates good insight into disease process. Alert and Oriented x 3. pleasant and cooperative. Notes Upon evaluation today I did listen to the patient's lungs I did not hear any atypical findings at this point. Again he states that he has intermittent hematuria that is often bad enough that it actually bleeds throughout the night every time he has to go to the bathroom and this is quite frequent at times. It is not always that significant but it has become more of a nuisance to him he tells me. Nonetheless he states that if we can get him approved for  the HBO therapy he would like to proceed as such. Electronic Signature(s) Signed: 05/27/2019 4:41:31 PM By: Worthy Keeler PA-C Entered By: Worthy Keeler on 05/27/2019 16:41:31 Roger [name], Roger [name] ([id number]) -------------------------------------------------------------------------------- Physician Orders Details Patient Name: Roger [name] Date of Service: 05/27/2019 2:15 PM Medical Record Number: [id number] Patient Account Number: [id number] Date of Birth/Sex: 16-Oct-1950 (68 y.o. M) Treating RN: Montey Hora Primary Care Provider: Josephine Cables Other Clinician: Referring Provider: Cheral Almas Treating Provider/Extender: Melburn Hake, Bentley Haralson Weeks in Treatment: 0 Verbal / Phone Orders: No Diagnosis Coding ICD-10 Coding Code Description N30.41 Irradiation cystitis with hematuria Z92.3 Personal history of irradiation Z85.46 Personal history of malignant neoplasm of prostate Hyperbaric Oxygen Therapy o Evaluate for HBO Therapy o Indication: - Radiation Cystitis o If appropriate for treatment, begin HBOT per protocol: o 2.0 ATA for 90 Minutes with 2 Five (5) Minute Air Breaks o One treatment per day (delivered Monday through Friday unless otherwise specified in Special Instructions below): o Total # of Treatments: - 40 o Other - Please call us and let us know when you get an appointment with the specialist at [phone] Electronic Signature(s) Signed: 05/27/2019 5:17:10 PM By: Montey Hora Signed: 05/28/2019 1:07:32 AM By: Worthy Keeler PA-C Entered By: Montey Hora on 05/27/2019 15:23:56 Roger [name], Roger [name] ([id number]) -------------------------------------------------------------------------------- Problem List Details Patient Name: Roger [name] Date of Service: 05/27/2019 2:15 PM Medical Record Number: [id number] Patient Account Number: [id number] Date of Birth/Sex: 1951-02-17 (68 y.o. M) Treating RN: Montey Hora Primary Care  Provider: Josephine Cables Other Clinician: Referring Provider: Cheral Almas Treating Provider/Extender: Melburn Hake, Aviv Rota Weeks in Treatment: 0 Active Problems ICD-10 Evaluated Encounter Code Description Active Date Today Diagnosis N30.41 Irradiation cystitis with hematuria 05/27/2019 No Yes Z92.3 Personal history of irradiation 05/27/2019 No  Yes Z85.46 Personal history of malignant neoplasm of prostate 05/27/2019 No Yes Inactive Problems Resolved Problems Electronic Signature(s) Signed: 05/27/2019 3:09:06 PM By: Worthy Keeler PA-C Entered By: Worthy Keeler on 05/27/2019 15:09:05 Roger [name], Roger [name] ([id number]) -------------------------------------------------------------------------------- Progress Note Details Patient Name: Roger [name] Date of Service: 05/27/2019 2:15 PM Medical Record Number: [id number] Patient Account Number: [id number] Date of Birth/Sex: 1951/02/02 (68 y.o. 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 As of this time I do not have the exact parameters that which he was treated as far as the dose of radiation nor the exact timeline although it was in 2018 that he was treated for prostate cancer by way of radiation. Unfortunately for the past several months he tells me has been having a lot of hematuria and also discomfort during the times when he is urinating sometimes the bleeding is quite significant. Obviously this is very frustrating for him. On top of this he is also had some issues recently with  a nodule which was noted in his lungs and apparently he has been referred to a pulmonologist to further evaluate this. With that being said he does not know who he is supposed to be seen. In fact he has not really been told at this point. Nonetheless it does sound like likely this will be pulmonology. He actually says that he just consumes alcohol 1 or 2 beers a couple times a week if he is having a lot of discomfort. With that being said he was recently in the hospital where he stated the same thing but was found unfortunately to have a critical alcohol level measuring out at 306. Obviously this seems to be quite a bit elevated compared to what his history would portray. This ER appointment was actually on 05/12/2019. Subsequently it was noted that he did have specifically a left apical nodular opacity slightly more prominent compared to 01/25/2019 and a CT scan was recommended. There did not appear to be any acute airspace disease according to the x-ray report. Thus with these findings I do believe that the patient is likely get a require further evaluation by pulmonology to be cleared for hyperbarics. I also think based on the fact that he does seem to be an alcoholic with significant findings on evaluation here that he is in a likely also need a ongoing monitoring with regard to his alcohol intake and likely would benefit from having air breaks if he does proceed with hyperbaric oxygen therapy. We do need to obtain records specifically on the doses of radiation he had as well as the dates more exactly for when he had the radiation in 2018. Patient History Information obtained from Patient. Allergies No Known Drug Allergies Family History Diabetes - Siblings, Hypertension - Siblings, Kidney Disease - Siblings, No family history of Cancer, Heart Disease, Lung Disease, Seizures, Stroke, Thyroid Problems, Tuberculosis. Social History Current every day smoker, Marital Status - Single, Alcohol Use  - Daily, Drug Use - No History, Caffeine Use - Daily. Medical History Ear/Nose/Mouth/Throat Patient has history of Middle ear problems - hole in eardrum Cardiovascular Patient has history of Hypotension Genitourinary Kaps,  FLAVIAN MCILWAIN ([id number]) Denies history of End Stage Renal Disease Oncologic Patient has history of Received Radiation - [facility] Denies history of Received Chemotherapy Medical And Surgical History Notes Eyes Eye sight comes and goes. Genitourinary Radiation cystitis Neurologic Stroke Oncologic Prostate Cancer 2018 Review of Systems (ROS) Eyes Denies complaints or symptoms of Dry Eyes, Vision Changes, Glasses / Contacts. Ear/Nose/Mouth/Throat Denies complaints or symptoms of Difficult clearing ears, Sinusitis. Hematologic/Lymphatic Denies complaints or symptoms of Bleeding / Clotting Disorders, Human Immunodeficiency Virus. Respiratory Denies complaints or symptoms of Chronic or frequent coughs, Shortness of Breath. Cardiovascular Denies complaints or symptoms of Chest pain, LE edema. Endocrine Denies complaints or symptoms of Hepatitis, Thyroid disease, Polydypsia (Excessive Thirst). Integumentary (Skin) Denies complaints or symptoms of Wounds, Bleeding or bruising tendency, Breakdown, Swelling. Musculoskeletal Denies complaints or symptoms of Muscle Pain, Muscle Weakness. Neurologic Denies complaints or symptoms of Numbness/parasthesias, Focal/Weakness. Objective Constitutional sitting or standing blood pressure is within target range for patient.. pulse regular and within target range for patient.Marland Kitchen respirations regular, non-labored and within target range for patient.Marland Kitchen temperature within target range for patient.. Well- nourished and well-hydrated in no acute distress. Vitals Time Taken: 2:41 PM, Height: 72 in, Weight: 145 lbs, BMI: 19.7, Temperature: 98.6 F, Pulse: 74 bpm, Respiratory Rate: 16 breaths/min, Blood Pressure: 130/64  mmHg. Eyes conjunctiva clear no eyelid edema noted. pupils equal round and reactive to light and accommodation. Ears, Nose, Mouth, and Throat no gross abnormality of ear auricles or external auditory canals. normal hearing noted during conversation. mucus Roger [name], Roger [name]. ([id number]) membranes moist. Respiratory normal breathing without difficulty. clear to auscultation bilaterally. Cardiovascular regular rate and rhythm with normal S1, S2. 2+ dorsalis pedis/posterior tibialis pulses. no clubbing, cyanosis, significant edema, Gastrointestinal (GI) soft, non-tender, non-distended, +BS. no ventral hernia noted. Musculoskeletal normal gait and posture. no significant deformity or arthritic changes, no loss or range of motion, no clubbing. Psychiatric this patient is able to make decisions and demonstrates good insight into disease process. Alert and Oriented x 3. pleasant and cooperative. General Notes: Upon evaluation today I did listen to the patient's lungs I did not hear any atypical findings at this point. Again he states that he has intermittent hematuria that is often bad enough that it actually bleeds throughout the night every time he has to go to the bathroom and this is quite frequent at times. It is not always that significant but it has become more of a nuisance to him he tells me. Nonetheless he states that if we can get him approved for the HBO therapy he would like to proceed as such. Other Condition(s) Patient presents with Cystitis. General Notes: Patient states he has clots and cannot urinate. Sometimes has incontinence, when strains causes bleeding. Assessment Active Problems ICD-10 Irradiation cystitis with hematuria Personal history of irradiation Personal history of malignant neoplasm of prostate Plan Hyperbaric Oxygen Therapy: Evaluate for HBO Therapy Indication: - Radiation Cystitis If appropriate for treatment, begin HBOT per protocol: 2.0 ATA for 90  Minutes with 2 Five (5) Minute Air Breaks One treatment per day (delivered Monday through Friday unless otherwise specified in Special Instructions below): Total # of Treatments: - 40 Other - Please call us and let us know when you get an appointment with the specialist at [address] [place] ([id number]) 1. At this time the patient does have history positive for radiation cystitis with associated hematuria and I do believe that he would like to proceed with a hyperbaric oxygen therapy if we can gain  approval. I did put the orders in for that today. 2. Being that he is an alcoholic although he seems to be in somewhat either denial or else he is just misrepresenting I think that this is good to put him at risk for oxygen toxicity to some degree and I believe we do need to be cognizant of this likely he will need air breaks during the course of therapy. 3. With regard to the pulmonary nodule fortunately there was no airspace disease but I think we may need to go ahead and see about having him evaluated by pulmonology which already is supposed to be in the works prior to putting him in the chamber I did advise the patient that as soon as he gets the information to note when in who he is can be seen that he contact the office and let us know so that we can contact them and obtain those records as well. This will likely be with [facility]. 4. As soon as we can find out anything as far as approval is concerned for hyperbarics we will get in touch with him and let him know. Electronic Signature(s) Signed: 05/27/2019 4:42:48 PM By: Worthy Keeler PA-C Entered By: Worthy Keeler on 05/27/2019 16:42:48 Roger [name], Roger [name] ([id number]) -------------------------------------------------------------------------------- ROS/PFSH Details Patient Name: Roger [name] Date of Service: 05/27/2019 2:15 PM Medical Record Number: [id number] Patient Account Number: [id number] Date of Birth/Sex: 08/26/1950 (68  y.o. M) Treating RN: Cornell Barman Primary Care Provider: Josephine Cables Other Clinician: Referring Provider: Cheral Almas Treating Provider/Extender: Melburn Hake, Laporshia Hogen Weeks in Treatment: 0 Information Obtained From Patient Eyes Complaints and Symptoms: Negative for: Dry Eyes; Vision Changes; Glasses / Contacts Medical History: Past Medical History Notes: Eye sight comes and goes. Ear/Nose/Mouth/Throat Complaints and Symptoms: Negative for: Difficult clearing ears; Sinusitis Medical History: Positive for: Middle ear problems - hole in eardrum Hematologic/Lymphatic Complaints and Symptoms: Negative for: Bleeding / Clotting Disorders; Human Immunodeficiency Virus Respiratory Complaints and Symptoms: Negative for: Chronic or frequent coughs; Shortness of Breath Cardiovascular Complaints and Symptoms: Negative for: Chest pain; LE edema Medical History: Positive for: Hypotension Endocrine Complaints and Symptoms: Negative for: Hepatitis; Thyroid disease; Polydypsia (Excessive Thirst) Integumentary (Skin) Complaints and Symptoms: Negative for: Wounds; Bleeding or bruising tendency; Breakdown; Swelling Musculoskeletal Roger [name], RONDAN. ([id number]) Complaints and Symptoms: Negative for: Muscle Pain; Muscle Weakness Neurologic Complaints and Symptoms: Negative for: Numbness/parasthesias; Focal/Weakness Medical History: Past Medical History Notes: Stroke Gastrointestinal Genitourinary Medical History: Negative for: End Stage Renal Disease Past Medical History Notes: Radiation cystitis Immunological Oncologic Medical History: Positive for: Received Radiation - [facility] Negative for: Received Chemotherapy Past Medical History Notes: Prostate Cancer 2018 Psychiatric HBO Extended History Items Ear/Nose/Mouth/Throat: Middle ear problems Immunizations Pneumococcal Vaccine: Received Pneumococcal Vaccination: No Implantable Devices None Family and Social  History Cancer: No; Diabetes: Yes - Siblings; Heart Disease: No; Hypertension: Yes - Siblings; Kidney Disease: Yes - Siblings; Lung Disease: No; Seizures: No; Stroke: No; Thyroid Problems: No; Tuberculosis: No; Current every day smoker; Marital Status - Single; Alcohol Use: Daily; Drug Use: No History; Caffeine Use: Daily Electronic Signature(s) Signed: 05/27/2019 5:23:28 PM By: Gretta Cool, BSN, RN, CWS, Kim RN, BSN Signed: 05/28/2019 1:07:32 AM By: Worthy Keeler PA-C Entered By: Gretta Cool BSN, RN, CWS, Kim on 05/27/2019 14:49:33 Roger [name], Roger [name] ([id number]) -------------------------------------------------------------------------------- SuperBill Details Patient Name: Roger [name] Date of Service: 05/27/2019 Medical Record Number: [id number] Patient Account Number: [id number] Date of Birth/Sex: 03-29-1951 (68 y.o. M) Treating RN: Montey Hora Primary Care Provider: Josephine Cables Other  Clinician: Referring Provider: Cheral Almas Treating Provider/Extender: Melburn Hake, Serine Kea Weeks in Treatment: 0 Diagnosis Coding ICD-10 Codes Code Description N30.41 Irradiation cystitis with hematuria Z92.3 Personal history of irradiation Z85.46 Personal history of malignant neoplasm of prostate Facility Procedures CPT4 Code: [id number] Description: [id number] - WOUND CARE VISIT-LEV 3 EST PT Modifier: Quantity: 1 Physician Procedures CPT4 Code: [id number] Description: [id number] - WC PHYS LEVEL 4 - NEW PT ICD-10 Diagnosis Description N30.41 Irradiation cystitis with hematuria Z92.3 Personal history of irradiation Z85.46 Personal history of malignant neoplasm of prostate Modifier: Quantity: 1 Electronic Signature(s) Signed: 05/27/2019 4:43:02 PM By: Worthy Keeler PA-C Entered By: Worthy Keeler on 05/27/2019 16:43:02

## 2019-05-28 NOTE — Progress Notes (Signed)
LAY, GEHRES ([id number]) Visit Report for 05/27/2019 Abuse/Suicide Risk Screen Details Patient Name: Roger [name], Roger [name] Date of Service: 05/27/2019 2:15 PM Medical Record Number: [id number] Patient Account Number: [id number] Date of Birth/Sex: 1951/02/14 (68 y.o. M) Treating RN: Cornell Barman Primary Care Decie Verne: Josephine Cables Other Clinician: Referring Raha Tennison: Cheral Almas Treating Tam Delisle/Extender: Melburn Hake, HOYT Weeks in Treatment: 0 Abuse/Suicide Risk Screen Items Answer ABUSE RISK SCREEN: Has anyone close to you tried to hurt or harm you recentlyo No Do you feel uncomfortable with anyone in your familyo No Has anyone forced you do things that you didnot want to doo No Electronic Signature(s) Signed: 05/27/2019 5:23:28 PM By: Gretta Cool, BSN, RN, CWS, Kim RN, BSN Entered By: Gretta Cool, BSN, RN, CWS, Kim on 05/27/2019 14:49:39 Breton, Dana Allan ([id number]) -------------------------------------------------------------------------------- Activities of Daily Living Details Patient Name: Roger [name] Date of Service: 05/27/2019 2:15 PM Medical Record Number: [id number] Patient Account Number: [id number] Date of Birth/Sex: May 10, 1951 (68 y.o. M) Treating RN: Cornell Barman Primary Care Faythe Heitzenrater: Josephine Cables Other Clinician: Referring Tacora Athanas: Cheral Almas Treating Carlyn Mullenbach/Extender: Melburn Hake, HOYT Weeks in Treatment: 0 Activities of Daily Living Items Answer Activities of Daily Living (Please select one for each item) Drive Automobile Completely Able Take Medications Completely Able Use Telephone Completely Able Care for Appearance Completely Able Use Toilet Completely Able Bath / Shower Completely Able Dress Self Completely Able Feed Self Completely Able Walk Completely Able Get In / Out Bed Completely Able Housework Completely Able Prepare Meals Completely [place] for Self Completely Able Electronic  Signature(s) Signed: 05/27/2019 5:23:28 PM By: Gretta Cool, BSN, RN, CWS, Kim RN, BSN Entered By: Gretta Cool, BSN, RN, CWS, Kim on 05/27/2019 14:50:05 Kirshenbaum, Dana Allan ([id number]) -------------------------------------------------------------------------------- Education Screening Details Patient Name: Roger [name] Date of Service: 05/27/2019 2:15 PM Medical Record Number: [id number] Patient Account Number: [id number] Date of Birth/Sex: 04-Jun-1951 (68 y.o. M) Treating RN: Cornell Barman Primary Care Viana Sleep: Josephine Cables Other Clinician: Referring Cici Rodriges: Cheral Almas Treating Jaziyah Gradel/Extender: Sharalyn Ink in Treatment: 0 Primary Learner Assessed: Patient Learning Preferences/Education Level/Primary Language Learning Preference: Demonstration Highest Education Level: High School Preferred Language: English Cognitive Barrier Language Barrier: No Translator Needed: No Memory Deficit: No Emotional Barrier: No Cultural/Religious Beliefs Affecting Medical Care: No Physical Barrier Impaired Vision: No Impaired Hearing: No Decreased Hand dexterity: No Knowledge/Comprehension Knowledge Level: Medium Comprehension Level: Medium Ability to understand written Medium instructions: Ability to understand verbal Medium instructions: Motivation Anxiety Level: Calm Cooperation: Cooperative Education Importance: Acknowledges Need Interest in Health Problems: Asks Questions Perception: Coherent Willingness to Engage in Self- Medium Management Activities: Readiness to Engage in Self- Medium Management Activities: Electronic Signature(s) Signed: 05/27/2019 5:23:28 PM By: Gretta Cool, BSN, RN, CWS, Kim RN, BSN Entered By: Gretta Cool, BSN, RN, CWS, Kim on 05/27/2019 14:50:43 Sheahan, Dana Allan ([id number]) -------------------------------------------------------------------------------- Fall Risk Assessment Details Patient Name: Roger [name] Date of Service: 05/27/2019 2:15  PM Medical Record Number: [id number] Patient Account Number: [id number] Date of Birth/Sex: [birth year] (68 y.o. M) Treating RN: Cornell Barman Primary Care Kendricks Reap: Josephine Cables Other Clinician: Referring Leyla Soliz: Cheral Almas Treating Valia Wingard/Extender: Melburn Hake, HOYT Weeks in Treatment: 0 Fall Risk Assessment Items Have you had 2 or more falls in the last 12 monthso 0 No Have you had any fall that resulted in injury in the last 12 monthso 0 No FALLS RISK SCREEN History of falling - immediate or within 3 months 0 No Secondary diagnosis (Do you have 2 or more medical diagnoseso) 0 No Ambulatory aid None/bed  rest/wheelchair/nurse 0 No Crutches/cane/walker 15 Yes Furniture 0 No Intravenous therapy Access/Saline/Heparin Lock 0 No Gait/Transferring Normal/ bed rest/ wheelchair 0 No Weak (short steps with or without shuffle, stooped but able to lift head while 10 Yes walking, may seek support from furniture) Impaired (short steps with shuffle, may have difficulty arising from chair, head 0 No down, impaired balance) Mental Status Oriented to own ability 0 Yes Electronic Signature(s) Signed: 05/27/2019 5:23:28 PM By: Gretta Cool, BSN, RN, CWS, Kim RN, BSN Entered By: Gretta Cool, BSN, RN, CWS, Kim on 05/27/2019 14:51:11 Arcos, Dana Allan ([id number]) -------------------------------------------------------------------------------- Foot Assessment Details Patient Name: Roger [name] Date of Service: 05/27/2019 2:15 PM Medical Record Number: [id number] Patient Account Number: [id number] Date of Birth/Sex: Aug 15, 1950 (68 y.o. M) Treating RN: Cornell Barman Primary Care Latania Bascomb: Josephine Cables Other Clinician: Referring Jazziel Fitzsimmons: Cheral Almas Treating Reanne Nellums/Extender: Melburn Hake, HOYT Weeks in Treatment: 0 Foot Assessment Items Site Locations + = Sensation present, - = Sensation absent, C = Callus, U = Ulcer R = Redness, W = Warmth, M = Maceration, PU = Pre-ulcerative lesion F  = Fissure, S = Swelling, D = Dryness Assessment Right: Left: Other Deformity: No No Prior Foot Ulcer: No No Prior Amputation: No No Charcot Joint: No No Ambulatory Status: Ambulatory With Help Assistance Device: Cane Gait: Administrator, arts) Signed: 05/27/2019 5:23:28 PM By: Gretta Cool, BSN, RN, CWS, Kim RN, BSN Entered By: Gretta Cool, BSN, RN, CWS, Kim on 05/27/2019 14:51:46 Lienau, Dana Allan ([id number]) -------------------------------------------------------------------------------- Nutrition Risk Screening Details Patient Name: Roger [name] Date of Service: 05/27/2019 2:15 PM Medical Record Number: [id number] Patient Account Number: [id number] Date of Birth/Sex: 1951/04/30 (68 y.o. M) Treating RN: Cornell Barman Primary Care Yina Riviere: Josephine Cables Other Clinician: Referring Dezra Mandella: Cheral Almas Treating Iyesha Such/Extender: Melburn Hake, HOYT Weeks in Treatment: 0 Height (in): 72 Weight (lbs): 145 Body Mass Index (BMI): 19.7 Nutrition Risk Screening Items Score Screening NUTRITION RISK SCREEN: I have an illness or condition that made me change the kind and/or amount of 0 No food I eat I eat fewer than two meals per day 0 No I eat few fruits and vegetables, or milk products 2 Yes I have three or more drinks of beer, liquor or wine almost every day 0 No I have tooth or mouth problems that make it hard for me to eat 0 No I don't always have enough money to buy the food I need 0 No I eat alone most of the time 0 No I take three or more different prescribed or over-the-counter drugs a day 1 Yes Without wanting to, I have lost or gained 10 pounds in the last six months 0 No I am not always physically able to shop, cook and/or feed myself 0 No Nutrition Protocols Good Risk Protocol Provide education on Moderate Risk Protocol 0 nutrition High Risk Proctocol Risk Level: Moderate Risk Score: 3 Electronic Signature(s) Signed: 05/27/2019 5:23:28 PM By: Gretta Cool,  BSN, RN, CWS, Kim RN, BSN Entered By: Gretta Cool, BSN, RN, CWS, Kim on 05/27/2019 14:51:33

## 2019-05-28 NOTE — Progress Notes (Signed)
LUNSFORD, BENNER ([id number]) Visit Report for 05/27/2019 HBO Risk Assessment Details Patient Name: Roger [name], Roger [name] Date of Service: 05/27/2019 2:15 PM Medical Record Number: [id number] Patient Account Number: [id number] Date of Birth/Sex: 1951/04/18 (68 y.o. M) Treating RN: Cornell Barman Primary Care Ichael Pullara: Josephine Cables Other Clinician: Referring Saniyyah Elster: Cheral Almas Treating Riese Hellard/Extender: Melburn Hake, HOYT Weeks in Treatment: 0 HBO Risk Assessment Items Answer Any "Yes" answers must be brought to the hyperbaric physician's attention. Breathing or Lung problemso Yes Describe: chest hurts to breath/swallow Seen a lung doctor for any breathing or lung problemso Yes xray [place], spot on If yes, provide the name of doctor: lungs Currently use tobacco productso Yes Heart problemso No Do you take water pills (diuretic)o No Diabeteso No Dialysiso No Eye problems like glaucomao No Ear problems or surgeryo Yes Sinus Problemso No Cancero Yes List the location: prostate Surgery(s) for cancero No Radiation therapy for cancero Yes Chemotherapy for cancero No Confinement Anxiety (Claustrophobia- fear of confined places)o No Any medical implants/devices that are fully or partially implanted or attached to your No bodyo Pregnanto No Seizureso No Electronic Signature(s) Signed: 05/27/2019 5:23:28 PM By: Gretta Cool, BSN, RN, CWS, Kim RN, BSN Entered By: Gretta Cool, BSN, RN, CWS, Kim on 05/27/2019 15:08:37

## 2019-05-28 NOTE — Progress Notes (Addendum)
Roger, [name] ([id number]) Visit Report for 05/27/2019 Allergy List Details Patient Name: Roger [name] Date of Service: 05/27/2019 2:15 PM Medical Record Number: [id number] Patient Account Number: [id number] Date of Birth/Sex: 1950/12/24 (68 y.o. M) Treating RN: Cornell Barman Primary Care Rhealynn Myhre: Josephine Cables Other Clinician: Referring Lark Runk: Cheral Almas Treating Zhana Jeangilles/Extender: Melburn Hake, HOYT Weeks in Treatment: 0 Allergies Active Allergies No Known Drug Allergies Allergy Notes Electronic Signature(s) Signed: 05/27/2019 5:23:28 PM By: Gretta Cool, BSN, RN, CWS, Kim RN, BSN Entered By: Gretta Cool, BSN, RN, CWS, Kim on 05/27/2019 14:53:46 Roger [name] ([id number]) -------------------------------------------------------------------------------- Arrival Information Details Patient Name: Roger [name] Date of Service: 05/27/2019 2:15 PM Medical Record Number: [id number] Patient Account Number: [id number] Date of Birth/Sex: 1951/01/29 (68 y.o. M) Treating RN: Cornell Barman Primary Care Terryann Verbeek: Josephine Cables Other Clinician: Referring Wandy Bossler: Cheral Almas Treating Shian Goodnow/Extender: Melburn Hake, HOYT Weeks in Treatment: 0 Visit Information Patient Arrived: Cane Arrival Time: 14:38 Accompanied By: self Transfer Assistance: None Patient Identification Verified: Yes Secondary Verification Process Completed: Yes Patient Requires Transmission-Based Precautions: No Patient Has Alerts: No Electronic Signature(s) Signed: 05/27/2019 5:23:28 PM By: Gretta Cool, BSN, RN, CWS, Kim RN, BSN Entered By: Gretta Cool, BSN, RN, CWS, Kim on 05/27/2019 14:41:04 Roger [name] ([id number]) -------------------------------------------------------------------------------- Clinic Level of Care Assessment Details Patient Name: Roger [name] Date of Service: 05/27/2019 2:15 PM Medical Record Number: [id number] Patient Account Number: [id number] Date of Birth/Sex: 08-22-1950  (68 y.o. M) Treating RN: Montey Hora Primary Care Lynee Rosenbach: Josephine Cables Other Clinician: Referring Raenah Murley: Cheral Almas Treating Kirklin Mcduffee/Extender: Melburn Hake, HOYT Weeks in Treatment: 0 Clinic Level of Care Assessment Items TOOL 2 Quantity Score []  - Use when only an EandM is performed on the INITIAL visit 0 ASSESSMENTS - Nursing Assessment / Reassessment X - General Physical Exam (combine w/ comprehensive assessment (listed just below) when 1 20 performed on new pt. evals) X- 1 25 Comprehensive Assessment (HX, ROS, Risk Assessments, Wounds Hx, etc.) ASSESSMENTS - Wound and Skin Assessment / Reassessment []  - Simple Wound Assessment / Reassessment - one wound 0 []  - 0 Complex Wound Assessment / Reassessment - multiple wounds []  - 0 Dermatologic / Skin Assessment (not related to wound area) ASSESSMENTS - Ostomy and/or Continence Assessment and Care X - Incontinence Assessment and Management 1 10 []  - 0 Ostomy Care Assessment and Management (repouching, etc.) PROCESS - Coordination of Care X - Simple Patient / Family Education for ongoing care 1 15 []  - 0 Complex (extensive) Patient / Family Education for ongoing care X- 1 10 Staff obtains Programmer, systems, Records, Test Results / Process Orders []  - 0 Staff telephones HHA, Nursing Homes / Clarify orders / etc []  - 0 Routine Transfer to another Facility (non-emergent condition) []  - 0 Routine Hospital Admission (non-emergent condition) X- 1 15 New Admissions / Biomedical engineer / Ordering NPWT, Apligraf, etc. []  - 0 Emergency Hospital Admission (emergent condition) X- 1 10 Simple Discharge Coordination []  - 0 Complex (extensive) Discharge Coordination PROCESS - Special Needs []  - Pediatric / Minor Patient Management 0 []  - 0 Isolation Patient Management Roger [name]. ([id number]) []  - 0 Hearing / Language / Visual special needs []  - 0 Assessment of Community assistance (transportation, D/C  planning, etc.) []  - 0 Additional assistance / Altered mentation []  - 0 Support Surface(s) Assessment (bed, cushion, seat, etc.) INTERVENTIONS - Wound Cleansing / Measurement []  - Wound Imaging (photographs - any number of wounds) 0 []  - 0 Wound Tracing (instead of photographs) []  - 0 Simple Wound Measurement -  one wound []  - 0 Complex Wound Measurement - multiple wounds []  - 0 Simple Wound Cleansing - one wound []  - 0 Complex Wound Cleansing - multiple wounds INTERVENTIONS - Wound Dressings []  - Small Wound Dressing one or multiple wounds 0 []  - 0 Medium Wound Dressing one or multiple wounds []  - 0 Large Wound Dressing one or multiple wounds []  - 0 Application of Medications - injection INTERVENTIONS - Miscellaneous []  - External ear exam 0 []  - 0 Specimen Collection (cultures, biopsies, blood, body fluids, etc.) []  - 0 Specimen(s) / Culture(s) sent or taken to Lab for analysis []  - 0 Patient Transfer (multiple staff / Civil Service fast streamer / Similar devices) []  - 0 Simple Staple / Suture removal (25 or less) []  - 0 Complex Staple / Suture removal (26 or more) []  - 0 Hypo / Hyperglycemic Management (close monitor of Blood Glucose) []  - 0 Ankle / Brachial Index (ABI) - do not check if billed separately Has the patient been seen at the hospital within the last three years: Yes Total Score: 105 Level Of Care: New/Established - Level 3 Electronic Signature(s) Signed: 05/27/2019 5:17:10 PM By: Montey Hora Entered By: Montey Hora on 05/27/2019 15:15:03 Roger [name] ([id number]) -------------------------------------------------------------------------------- Encounter Discharge Information Details Patient Name: Roger [name] Date of Service: 05/27/2019 2:15 PM Medical Record Number: [id number] Patient Account Number: [id number] Date of Birth/Sex: Aug 10, 1950 (68 y.o. M) Treating RN: Montey Hora Primary Care Lavida Patch: Josephine Cables Other Clinician: Referring  Ethaniel Garfield: Cheral Almas Treating Blessing Zaucha/Extender: Melburn Hake, HOYT Weeks in Treatment: 0 Encounter Discharge Information Items Discharge Condition: Stable Ambulatory Status: Cane Discharge Destination: Home Transportation: Private Auto Accompanied By: self Schedule Follow-up Appointment: No Clinical Summary of Care: Electronic Signature(s) Signed: 05/27/2019 5:17:10 PM By: Montey Hora Entered By: Montey Hora on 05/27/2019 15:21:01 Roger [name] ([id number]) -------------------------------------------------------------------------------- Lower Extremity Assessment Details Patient Name: Roger [name] Date of Service: 05/27/2019 2:15 PM Medical Record Number: [id number] Patient Account Number: [id number] Date of Birth/Sex: 1951-05-19 (68 y.o. M) Treating RN: Cornell Barman Primary Care Toma Arts: Josephine Cables Other Clinician: Referring Destenie Ingber: Cheral Almas Treating Yahel Fuston/Extender: Sharalyn Ink in Treatment: 0 Electronic Signature(s) Signed: 05/27/2019 5:23:28 PM By: Gretta Cool, BSN, RN, CWS, Kim RN, BSN Entered By: Gretta Cool, BSN, RN, CWS, Kim on 05/27/2019 14:42:13 Roger [name] ([id number]) -------------------------------------------------------------------------------- Multi Wound Chart Details Patient Name: Roger [name] Date of Service: 05/27/2019 2:15 PM Medical Record Number: [id number] Patient Account Number: [id number] Date of Birth/Sex: February 08, 1951 (68 y.o. M) Treating RN: Montey Hora Primary Care Toshie Demelo: Josephine Cables Other Clinician: Referring Jakobie Henslee: Cheral Almas Treating Palma Buster/Extender: Melburn Hake, HOYT Weeks in Treatment: 0 Vital Signs Height(in): 72 Pulse(bpm): 74 Weight(lbs): 145 Blood Pressure(mmHg): 130/64 Body Mass Index(BMI): 20 Temperature(F): 98.6 Respiratory Rate 16 (breaths/min): Wound Assessments Treatment Notes Electronic Signature(s) Signed: 05/27/2019 5:17:10 PM By: Montey Hora Entered By: Montey Hora on 05/27/2019 15:14:04 Roger [name] ([id number]) -------------------------------------------------------------------------------- Multi-Disciplinary Care Plan Details Patient Name: Roger [name] Date of Service: 05/27/2019 2:15 PM Medical Record Number: [id number] Patient Account Number: [id number] Date of Birth/Sex: August 08, 1950 (68 y.o. M) Treating RN: Montey Hora Primary Care Naiyah Klostermann: Josephine Cables Other Clinician: Referring Inella Kuwahara: Cheral Almas Treating Yuvaan Olander/Extender: Sharalyn Ink in Treatment: 0 Active Inactive Electronic Signature(s) Signed: 07/25/2019 12:48:14 PM By: Gretta Cool, BSN, RN, CWS, Kim RN, BSN Signed: 09/23/2019 12:04:51 PM By: Montey Hora Previous Signature: 05/27/2019 5:17:10 PM Version By: Montey Hora Entered By: Gretta Cool BSN, RN, CWS, Kim on 07/25/2019 12:48:14 Roger [name] ([id number]) -------------------------------------------------------------------------------- Non-Wound  Condition Assessment Details Patient Name: DEVEYON, NAZARI Date of Service: 05/27/2019 2:15 PM Medical Record Number: [id number] Patient Account Number: [id number] Date of Birth/Sex: 27-Nov-1950 (68 y.o. M) Treating RN: Cornell Barman Primary Care Dorann Davidson: Josephine Cables Other Clinician: Referring Tajana Crotteau: Cheral Almas Treating Kaemon Barnett/Extender: Melburn Hake, HOYT Weeks in Treatment: 0 Non-Wound Condition: Condition: Cystitis Location: Other: bladder Side: Notes Patient states he has clots and cannot urinate. Sometimes has incontinence, when strains causes bleeding. Electronic Signature(s) Signed: 05/27/2019 5:23:28 PM By: Gretta Cool, BSN, RN, CWS, Kim RN, BSN Entered By: Gretta Cool, BSN, RN, CWS, Kim on 05/27/2019 14:53:32 Roger [name] ([id number]) -------------------------------------------------------------------------------- Pain Assessment Details Patient Name: Roger [name] Date of Service:  05/27/2019 2:15 PM Medical Record Number: [id number] Patient Account Number: [id number] Date of Birth/Sex: 01-Jun-1951 (68 y.o. M) Treating RN: Cornell Barman Primary Care Biannca Scantlin: Josephine Cables Other Clinician: Referring Michaiah Holsopple: Cheral Almas Treating Rashon Rezek/Extender: Melburn Hake, HOYT Weeks in Treatment: 0 Active Problems Location of Pain Severity and Description of Pain Patient Has Paino No Site Locations Pain Management and Medication Current Pain Management: Electronic Signature(s) Signed: 05/27/2019 5:23:28 PM By: Gretta Cool, BSN, RN, CWS, Kim RN, BSN Entered By: Gretta Cool, BSN, RN, CWS, Kim on 05/27/2019 14:41:12 Roger [name] ([id number]) -------------------------------------------------------------------------------- Patient/Caregiver Education Details Patient Name: Roger [name] Date of Service: 05/27/2019 2:15 PM Medical Record Number: [id number] Patient Account Number: [id number] Date of Birth/Gender: 1951/06/07 (68 y.o. M) Treating RN: Montey Hora Primary Care Physician: Josephine Cables Other Clinician: Referring Physician: Cheral Almas Treating Physician/Extender: Sharalyn Ink in Treatment: 0 Education Assessment Education Provided To: Patient Education Topics Provided Hyperbaric Oxygenation: Handouts: Hyperbaric Oxygen Methods: Explain/Verbal Responses: State content correctly Electronic Signature(s) Signed: 05/27/2019 5:17:10 PM By: Montey Hora Entered By: Montey Hora on 05/27/2019 15:15:16 Roger [name] ([id number]) -------------------------------------------------------------------------------- [place] Details Patient Name: Roger [name] Date of Service: 05/27/2019 2:15 PM Medical Record Number: [id number] Patient Account Number: [id number] Date of Birth/Sex: 02/21/1951 (68 y.o. M) Treating RN: Cornell Barman Primary Care Karly Pitter: Josephine Cables Other Clinician: Referring Cherith Tewell: Cheral Almas Treating  Tarik Teixeira/Extender: Melburn Hake, HOYT Weeks in Treatment: 0 Vital Signs Time Taken: 14:41 Temperature (F): 98.6 Height (in): 72 Pulse (bpm): 74 Weight (lbs): 145 Respiratory Rate (breaths/min): 16 Body Mass Index (BMI): 19.7 Blood Pressure (mmHg): 130/64 Reference Range: 80 - 120 mg / dl Electronic Signature(s) Signed: 05/27/2019 5:23:28 PM By: Gretta Cool, BSN, RN, CWS, Kim RN, BSN Entered By: Gretta Cool, BSN, RN, CWS, Kim on 05/27/2019 14:41:48

## 2021-09-04 DEATH — deceased
